# Patient Record
Sex: Female | Born: 1950
Health system: Southern US, Community
[De-identification: ages and names within clinical notes are randomized; demographics above are authoritative.]

## PROBLEM LIST (undated history)

## (undated) DIAGNOSIS — I10 Essential (primary) hypertension: Secondary | ICD-10-CM

## (undated) DIAGNOSIS — F32A Depression, unspecified: Secondary | ICD-10-CM

## (undated) DIAGNOSIS — F329 Major depressive disorder, single episode, unspecified: Secondary | ICD-10-CM

## (undated) DIAGNOSIS — E079 Disorder of thyroid, unspecified: Secondary | ICD-10-CM

## (undated) DIAGNOSIS — C4490 Unspecified malignant neoplasm of skin, unspecified: Secondary | ICD-10-CM

## (undated) DIAGNOSIS — M1990 Unspecified osteoarthritis, unspecified site: Secondary | ICD-10-CM

## (undated) DIAGNOSIS — G252 Other specified forms of tremor: Secondary | ICD-10-CM

## (undated) DIAGNOSIS — D496 Neoplasm of unspecified behavior of brain: Secondary | ICD-10-CM

## (undated) HISTORY — PX: TONSILLECTOMY: SUR1361

## (undated) HISTORY — PX: ANKLE SURGERY: SHX546

## (undated) HISTORY — PX: ADENOIDECTOMY: SUR15

## (undated) HISTORY — PX: CERVICAL SPINE SURGERY: SHX589

## (undated) HISTORY — PX: ROTATOR CUFF REPAIR: SHX139

## (undated) HISTORY — PX: THYROID SURGERY: SHX805

## (undated) HISTORY — PX: DEEP BRAIN STIMULATOR PLACEMENT: SHX608

## (undated) HISTORY — PX: BRAIN SURGERY: SHX531

## (undated) HISTORY — PX: ABDOMINAL HYSTERECTOMY: SHX81

## (undated) HISTORY — PX: BACK SURGERY: SHX140

## (undated) HISTORY — PX: LUMBAR SPINE SURGERY: SHX701

---

## 2010-08-21 ENCOUNTER — Emergency Department (INDEPENDENT_AMBULATORY_CARE_PROVIDER_SITE_OTHER): Payer: BC Managed Care – PPO

## 2010-08-21 ENCOUNTER — Emergency Department (HOSPITAL_BASED_OUTPATIENT_CLINIC_OR_DEPARTMENT_OTHER)
Admission: EM | Admit: 2010-08-21 | Discharge: 2010-08-21 | Disposition: A | Discharge status: Home / Self Care | Payer: BC Managed Care – PPO | Attending: Emergency Medicine | Admitting: Emergency Medicine

## 2010-08-21 DIAGNOSIS — E041 Nontoxic single thyroid nodule: Secondary | ICD-10-CM

## 2010-08-21 DIAGNOSIS — R079 Chest pain, unspecified: Secondary | ICD-10-CM

## 2010-08-21 DIAGNOSIS — E049 Nontoxic goiter, unspecified: Secondary | ICD-10-CM | POA: Insufficient documentation

## 2010-08-21 DIAGNOSIS — S2249XA Multiple fractures of ribs, unspecified side, initial encounter for closed fracture: Secondary | ICD-10-CM | POA: Insufficient documentation

## 2010-08-21 DIAGNOSIS — I517 Cardiomegaly: Secondary | ICD-10-CM | POA: Insufficient documentation

## 2010-08-21 DIAGNOSIS — R0789 Other chest pain: Secondary | ICD-10-CM

## 2010-08-21 DIAGNOSIS — Z79899 Other long term (current) drug therapy: Secondary | ICD-10-CM | POA: Insufficient documentation

## 2010-08-21 LAB — COMPREHENSIVE METABOLIC PANEL
ALT: 7 U/L (ref 0–35)
AST: 12 U/L (ref 0–37)
Albumin: 3.3 g/dL — ABNORMAL LOW (ref 3.5–5.2)
Alkaline Phosphatase: 93 U/L (ref 39–117)
BUN: 10 mg/dL (ref 6–23)
CO2: 29 mEq/L (ref 19–32)
Calcium: 9.1 mg/dL (ref 8.4–10.5)
Chloride: 97 mEq/L (ref 96–112)
Creatinine, Ser: 0.9 mg/dL (ref 0.4–1.2)
GFR calc Af Amer: >60 mL/min (ref >60)
GFR calc non Af Amer: >60 mL/min (ref >60)
Glucose, Bld: 97 mg/dL (ref 70–99)
Potassium: 3.1 mEq/L — ABNORMAL LOW (ref 3.5–5.1)
Sodium: 136 mEq/L (ref 135–145)
Total Bilirubin: 0.2 mg/dL — ABNORMAL LOW (ref 0.3–1.2)
Total Protein: 8 g/dL (ref 6.0–8.3)

## 2010-08-21 LAB — DIFFERENTIAL
Basophils Absolute: 0 10*3/uL (ref 0.0–0.1)
Basophils Relative: 0 % (ref 0–1)
Eosinophils Absolute: 0.2 10*3/uL (ref 0.0–0.7)
Eosinophils Relative: 3 % (ref 0–5)
Lymphocytes Relative: 23 % (ref 12–46)
Lymphs Abs: 2 10*3/uL (ref 0.7–4.0)
Monocytes Absolute: 0.8 10*3/uL (ref 0.1–1.0)
Monocytes Relative: 9 % (ref 3–12)
Neutro Abs: 5.7 10*3/uL (ref 1.7–7.7)
Neutrophils Relative %: 66 % (ref 43–77)

## 2010-08-21 LAB — CBC
HCT: 34.4 % — ABNORMAL LOW (ref 36.0–46.0)
Hemoglobin: 11.6 g/dL — ABNORMAL LOW (ref 12.0–15.0)
MCH: 30.4 pg (ref 26.0–34.0)
MCHC: 33.7 g/dL (ref 30.0–36.0)
MCV: 90.3 fL (ref 78.0–100.0)
Platelets: 349 10*3/uL (ref 150–400)
RBC: 3.81 MIL/uL — ABNORMAL LOW (ref 3.87–5.11)
RDW: 12.8 % (ref 11.5–15.5)
WBC: 8.8 10*3/uL (ref 4.0–10.5)

## 2010-08-21 LAB — D-DIMER, QUANTITATIVE: D-Dimer, Quant: 0.72 ug/mL-FEU — ABNORMAL HIGH (ref 0.00–0.48)

## 2010-08-21 MED ORDER — IOHEXOL 350 MG/ML SOLN
80.0000 mL | Freq: Once | INTRAVENOUS | Status: AC | PRN
  Administered 2010-08-21: 80 mL via INTRAVENOUS

## 2011-06-13 ENCOUNTER — Emergency Department (INDEPENDENT_AMBULATORY_CARE_PROVIDER_SITE_OTHER): Payer: BC Managed Care – PPO

## 2011-06-13 ENCOUNTER — Encounter (HOSPITAL_BASED_OUTPATIENT_CLINIC_OR_DEPARTMENT_OTHER): Payer: Self-pay | Admitting: *Deleted

## 2011-06-13 ENCOUNTER — Emergency Department (HOSPITAL_BASED_OUTPATIENT_CLINIC_OR_DEPARTMENT_OTHER)
Admission: EM | Admit: 2011-06-13 | Discharge: 2011-06-13 | Disposition: A | Discharge status: Home Health | Payer: BC Managed Care – PPO | Attending: Emergency Medicine | Admitting: Emergency Medicine

## 2011-06-13 DIAGNOSIS — W19XXXA Unspecified fall, initial encounter: Secondary | ICD-10-CM | POA: Insufficient documentation

## 2011-06-13 DIAGNOSIS — S4990XA Unspecified injury of shoulder and upper arm, unspecified arm, initial encounter: Secondary | ICD-10-CM

## 2011-06-13 DIAGNOSIS — S4980XA Other specified injuries of shoulder and upper arm, unspecified arm, initial encounter: Secondary | ICD-10-CM | POA: Insufficient documentation

## 2011-06-13 DIAGNOSIS — S46909A Unspecified injury of unspecified muscle, fascia and tendon at shoulder and upper arm level, unspecified arm, initial encounter: Secondary | ICD-10-CM | POA: Insufficient documentation

## 2011-06-13 DIAGNOSIS — S43109A Unspecified dislocation of unspecified acromioclavicular joint, initial encounter: Secondary | ICD-10-CM

## 2011-06-13 DIAGNOSIS — E079 Disorder of thyroid, unspecified: Secondary | ICD-10-CM | POA: Insufficient documentation

## 2011-06-13 HISTORY — DX: Depression, unspecified: F32.A

## 2011-06-13 HISTORY — DX: Neoplasm of unspecified behavior of brain: D49.6

## 2011-06-13 HISTORY — DX: Disorder of thyroid, unspecified: E07.9

## 2011-06-13 HISTORY — DX: Major depressive disorder, single episode, unspecified: F32.9

## 2011-06-13 HISTORY — DX: Other specified forms of tremor: G25.2

## 2011-06-13 MED ORDER — CYCLOBENZAPRINE HCL 10 MG PO TABS
5.0000 mg | ORAL_TABLET | Freq: Once | ORAL | Status: AC
  Administered 2011-06-13: 5 mg via ORAL
  Filled 2011-06-13: qty 1

## 2011-06-13 MED ORDER — HYDROCODONE-ACETAMINOPHEN 5-500 MG PO TABS: 1.0000 | ORAL_TABLET | Freq: Four times a day (QID) | ORAL | Status: AC | PRN

## 2011-06-13 MED ORDER — HYDROCODONE-ACETAMINOPHEN 5-325 MG PO TABS
1.0000 | ORAL_TABLET | Freq: Once | ORAL | Status: AC
  Administered 2011-06-13: 1 via ORAL
  Filled 2011-06-13: qty 1

## 2011-06-13 MED ORDER — CYCLOBENZAPRINE HCL 10 MG PO TABS: 10.0000 mg | ORAL_TABLET | Freq: Two times a day (BID) | ORAL | Status: AC | PRN

## 2011-06-13 NOTE — ED Notes (Signed)
Went to discharge pt.  Pt requesting either a stronger pain medication or more tablets of hydrocodone.  Spoke to [place],  She related to follow up with orthopedic physician for additional pain medication.  Pt related verbal understanding.

## 2011-06-13 NOTE — ED Provider Notes (Signed)
History     CSN: [id number]  Arrival date & time 06/13/11  1342   First MD Initiated Contact with Patient 06/13/11 1403      Chief Complaint  Patient presents with  . Fall    (Consider location/radiation/quality/duration/timing/severity/associated sxs/prior treatment) HPI Comments: Pt states that she fell on to her left shoulder and heard a crack:pt states that she has pain with movement:no weakness or numbness:pt states that ibuprofen didn't work  Patient is a 61 y.o. female presenting with fall. The history is provided by the patient. No language interpreter was used.  Fall The accident occurred 2 days ago. She landed on a hard floor. The point of impact was the left shoulder. The pain is moderate. She was ambulatory at the scene. There was no entrapment after the fall. There was no drug use involved in the accident. There was no alcohol use involved in the accident. Pertinent negatives include no abdominal pain, no bowel incontinence, no hearing loss, no loss of consciousness and no tingling. The symptoms are aggravated by activity. She has tried NSAIDs for the symptoms.    Past Medical History  Diagnosis Date  . Postural Tremor   . Thyroid disease   . Brain tumor   . Depression     Past Surgical History  Procedure Date  . Abdominal hysterectomy   . Thyroid surgery   . Back surgery   . Brain surgery   . Rotator cuff repair   . Ankle surgery   . Tonsillectomy   . Adenoidectomy     History reviewed. No pertinent family history.  History  Substance Use Topics  . Smoking status: Never Smoker   . Smokeless tobacco: Not on file  . Alcohol Use: No    OB History    Grav Para Term Preterm Abortions TAB SAB Ect Mult Living                  Review of Systems  Gastrointestinal: Negative for abdominal pain and bowel incontinence.  Neurological: Negative for tingling and loss of consciousness.  All other systems reviewed and are negative.    Allergies  Sulfa  antibiotics and Codeine  Home Medications   Current Outpatient Rx  Name Route Sig Dispense Refill  . KLONOPIN PO Oral Take 1 tablet by mouth 3 (three) times daily.    Marland Kitchen PREMARIN PO Oral Take 1 tablet by mouth 1 day or 1 dose.    Marland Kitchen SYNTHROID PO Oral Take 1 tablet by mouth once.    . INDERAL PO Oral Take 1 tablet by mouth 1 day or 1 dose.    . EFFEXOR PO Oral Take 1 tablet by mouth 1 day or 1 dose.      BP 131/84  Pulse 74  Temp(Src) 98.1 F (36.7 C) (Oral)  Resp 20  Ht 5\' 5"  (1.651 m)  Wt 180 lb (81.647 kg)  BMI 29.95 kg/m2  SpO2 98%  Physical Exam  Nursing note and vitals reviewed. Constitutional: She is oriented to person, place, and time. She appears well-developed and well-nourished.  HENT:  Head: Normocephalic and atraumatic.  Eyes: EOM are normal.  Neck: Neck supple.  Cardiovascular: Normal rate and regular rhythm.   Pulmonary/Chest: Effort normal and breath sounds normal.  Abdominal: Soft. Bowel sounds are normal.  Musculoskeletal: Normal range of motion.       Pt tender on the left shoulder pt unable to posteriorly rotate:no gross deformity noted  Neurological: She is alert and oriented to person,  place, and time.  Skin: Skin is warm and dry.  Psychiatric: She has a normal mood and affect.    ED Course  Procedures (including critical care time)  Labs Reviewed - No data to display Dg Shoulder Left  06/13/2011  *RADIOLOGY REPORT*  Clinical Data: Fall  LEFT SHOULDER - 2+ VIEW  Comparison: None.  Findings: The left AC joint is slightly displaced and widened.  No acute fracture.  Glenohumeral joint is anatomically aligned.  No fracture.  IMPRESSION: The above findings suggest AC joint injury.  Original Report Authenticated By: Donavan Burnet, M.D.     1. Acromioclavicular [facility]) joint injury       MDM  Pt placed in a sling for comfort:pt is okay to follow up with her orthopedist:will treat symptomatically        Teressa Lower, NP 06/13/11 1519

## 2011-06-13 NOTE — ED Provider Notes (Signed)
Medical screening examination/treatment/procedure(s) were performed by non-physician practitioner and as supervising physician I was immediately available for consultation/collaboration.   Kadasia Kassing A Alvera Tourigny, MD 06/13/11 1530 

## 2011-06-13 NOTE — ED Notes (Signed)
Pt states she "had about with sciatic pain the other day and was taking out the trash when she fell into a glass door (did not break) Now c/o left shoulder pain and spasm down into arm and shoulder blade.  Hurting in right arm as well. Ambulatory to ED.

## 2011-10-09 ENCOUNTER — Emergency Department (HOSPITAL_BASED_OUTPATIENT_CLINIC_OR_DEPARTMENT_OTHER)
Admission: EM | Admit: 2011-10-09 | Discharge: 2011-10-09 | Disposition: A | Discharge status: Home / Self Care | Payer: Medicare Other | Attending: Emergency Medicine | Admitting: Emergency Medicine

## 2011-10-09 ENCOUNTER — Emergency Department (HOSPITAL_BASED_OUTPATIENT_CLINIC_OR_DEPARTMENT_OTHER): Payer: Medicare Other

## 2011-10-09 ENCOUNTER — Encounter (HOSPITAL_BASED_OUTPATIENT_CLINIC_OR_DEPARTMENT_OTHER): Payer: Self-pay | Admitting: *Deleted

## 2011-10-09 DIAGNOSIS — M25539 Pain in unspecified wrist: Secondary | ICD-10-CM | POA: Insufficient documentation

## 2011-10-09 DIAGNOSIS — F3289 Other specified depressive episodes: Secondary | ICD-10-CM | POA: Insufficient documentation

## 2011-10-09 DIAGNOSIS — F329 Major depressive disorder, single episode, unspecified: Secondary | ICD-10-CM | POA: Insufficient documentation

## 2011-10-09 MED ORDER — MELOXICAM 7.5 MG PO TABS: 7.5000 mg | ORAL_TABLET | Freq: Every day | ORAL | Status: DC

## 2011-10-09 NOTE — ED Provider Notes (Signed)
History     CSN: [id number]  Arrival date & time 10/09/11  1419   First MD Initiated Contact with Patient 10/09/11 1440      Chief Complaint  Patient presents with  . Fall    (Consider location/radiation/quality/duration/timing/severity/associated sxs/prior treatment) HPI Comments: Patient presents with complaint of bilateral wrist pain that has been gradually worsening over the past 2 weeks. Pain began after the patient had a fall from standing onto a hard surface onto her wrists. She had no evaluation initially after the fall. Patient has been taking ibuprofen several times a day for pain with mild relief. Movement of her wrist makes the pain worse. Onset was acute. Patient denies other injuries in the fall.  Patient is a 61 y.o. female presenting with fall. The history is provided by the patient.  Fall The accident occurred more than 1 week ago. The fall occurred while walking. Distance fallen: From standing. She landed on a hard floor. The pain is mild. Pertinent negatives include no numbness. The symptoms are aggravated by use of the injured limb. She has tried NSAIDs for the symptoms. The treatment provided mild relief.    Past Medical History  Diagnosis Date  . Postural Tremor   . Thyroid disease   . Brain tumor   . Depression     Past Surgical History  Procedure Date  . Abdominal hysterectomy   . Thyroid surgery   . Back surgery   . Brain surgery   . Rotator cuff repair   . Ankle surgery   . Tonsillectomy   . Adenoidectomy     No family history on file.  History  Substance Use Topics  . Smoking status: Never Smoker   . Smokeless tobacco: Not on file  . Alcohol Use: No    OB History    Grav Para Term Preterm Abortions TAB SAB Ect Mult Living                  Review of Systems  Constitutional: Negative for activity change.  HENT: Negative for neck pain.   Musculoskeletal: Positive for arthralgias. Negative for back pain and joint swelling.  Skin:  Negative for wound.  Neurological: Negative for weakness and numbness.    Allergies  Sulfa antibiotics and Codeine  Home Medications   Current Outpatient Rx  Name Route Sig Dispense Refill  . KLONOPIN PO Oral Take 1 tablet by mouth 3 (three) times daily.    Marland Kitchen PREMARIN PO Oral Take 1 tablet by mouth 1 day or 1 dose.    Marland Kitchen SYNTHROID PO Oral Take 1 tablet by mouth once.    . INDERAL PO Oral Take 1 tablet by mouth 1 day or 1 dose.    . EFFEXOR PO Oral Take 1 tablet by mouth 1 day or 1 dose.      BP 152/87  Pulse 60  Temp 97.9 F (36.6 C) (Oral)  Resp 20  SpO2 99%  Physical Exam  Nursing note and vitals reviewed. Constitutional: She is oriented to person, place, and time. She appears well-developed and well-nourished.  HENT:  Head: Normocephalic and atraumatic.  Eyes: Pupils are equal, round, and reactive to light.  Neck: Normal range of motion. Neck supple.  Cardiovascular: Exam reveals no decreased pulses.   Musculoskeletal: Normal range of motion. She exhibits tenderness. She exhibits no edema.       Right wrist: She exhibits tenderness (diffuse). She exhibits normal range of motion.       Left wrist:  She exhibits tenderness. She exhibits normal range of motion.       Right hand: normal sensation noted. Decreased sensation is not present in the ulnar distribution, is not present in the medial distribution and is not present in the radial distribution. Normal strength noted. She exhibits no finger abduction, no thumb/finger opposition and no wrist extension trouble.       Left hand: Normal. normal sensation noted. Decreased sensation is not present in the ulnar distribution, is not present in the medial redistribution and is not present in the radial distribution. Normal strength noted. She exhibits no finger abduction, no thumb/finger opposition and no wrist extension trouble.       No anatomic snuffbox tenderness bilaterally.  Neurological: She is alert and oriented to person,  place, and time. No sensory deficit.       Motor, sensation, and vascular distal to the injury is fully intact.   Skin: Skin is warm and dry.  Psychiatric: She has a normal mood and affect.    ED Course  Procedures (including critical care time)  Labs Reviewed - No data to display Dg Wrist Complete Left  10/09/2011  *RADIOLOGY REPORT*  Clinical Data: Fall.  Left wrist pain.  LEFT WRIST - COMPLETE 3+ VIEW  Comparison: Contralateral extremity same day.  Findings: Anatomic alignment of the left wrist.  Nonspecific carpal cyst is present within the lunate.  Scaphoid bone is intact. Moderate STT joint osteoarthritis is present.  Small erosion is present at the STT joint involving the distal scaphoid pole, probably degenerative but nonspecific. No fracture.  IMPRESSION: 1.  No acute osseous abnormality. 2.  Moderate STT joint osteoarthritis.  Small erosion in the distal scaphoid pole is nonspecific.  This could be associated with an inflammatory arthropathy such as rheumatoid or ordinary osteoarthritis with synovitis.  Original Report Authenticated By: Andreas Newport, M.D.   Dg Wrist Complete Right  10/09/2011  *RADIOLOGY REPORT*  Clinical Data: Fall 2 months ago.  Left wrist pain.  RIGHT WRIST - COMPLETE 3+ VIEW  Comparison: Contralateral extremity same day.  Findings: There is no fracture.  Anatomic alignment.  Moderate STT joint osteoarthritis is present.  No erosions.  Soft tissues appear within normal limits. Scaphoid view is normal.  IMPRESSION: Osteoarthritis of the wrist.  No acute osseous abnormality.  Original Report Authenticated By: Andreas Newport, M.D.     1. Wrist pain     3:13 PM Patient seen and examined. X-rays reviewed. Patient informed of results.   Vital signs reviewed and are as follows: Filed Vitals:   10/09/11 1425  BP: 152/87  Pulse: 60  Temp: 97.9 F (36.6 C)  Resp: 20   Will use trial rx NSAID for pain. Told not to continue taking ibuprofen if taking this. If this  does not improve symptoms, patient has [facility]opedic f/[facility] in [facility] and she was encouraged to follow-up.    MDM  Wrist pain, s/p fall 2 weeks ago. Full ROM of wrists. X-rays neg. Conservative management indicated with ortho f/u if not improving.         [place], [place] 10/09/11 [phone]

## 2011-10-09 NOTE — Discharge Instructions (Signed)
Please read and follow all provided instructions.  Your diagnoses today include:  1. Wrist pain     Tests performed today include:  Xrays of your wrists - do NOT show any broken bones  Vital signs. See below for your results today.   Medications prescribed:   Mobic - anti-inflammatory pain medication Do not take your ibuprofen while taking this medication  You have been prescribed an anti-inflammatory medication or NSAID. Take with food. Take smallest effective dose for the shortest duration needed for your pain. Stop taking if you experience stomach pain or vomiting.   Take any prescribed medications only as directed.  Home care instructions:   Follow any educational materials contained in this packet  Follow R.I.C.E. Protocol:  R - rest your injury   I  - use ice on injury without applying directly to skin  C - compress injury with bandage or splint  E - elevate the injury as much as possible  Follow-up instructions: Please follow-up with your primary care provider or the provided orthopedic (bone specialist) if you continue to have significant pain in 2 weeks.   If you do not have a primary care doctor -- see below for referral information.   Return instructions:   Please return if your toes are numb or tingling, appear gray or blue, or you have severe pain (also elevate leg and loosen splint or wrap)  Please return to the Emergency Department if you experience worsening symptoms.   Please return if you have any other emergent concerns.  Additional Information:  Your vital signs today were: BP 152/87  Pulse 60  Temp 97.9 F (36.6 C) (Oral)  Resp 20  SpO2 99% If your blood pressure (BP) was elevated above 135/85 this visit, please have this repeated by your doctor within one month. -------------- -------------- No Primary Care Doctor Call [facility]  [phone] Other agencies that provide inexpensive medical care    [facility]   [phone]    [facility]  [phone]    [facility]  [phone]    [facility]  [phone]    [facility]  [phone]    [facility]  [phone] -------------- RESOURCE GUIDE:  Dental Problems  Patients with Medicaid: [facility] [phone] [address]                                            [phone] [address] Phone:  [phone]                                                   Phone:  [phone]  If unable to pay or uninsured, contact:  [facility] or [facility]. to become qualified for the adult dental clinic.  Chronic Pain Problems Contact [facility]  [phone] Patients need to be referred by their primary care doctor.  Insufficient Money for Medicine Contact [employer]:  call "211" or [facility] [phone].  Psychological Services [facility]  [phone] [facility]  [phone] [facility]  [phone] (emergency services [phone])  Substance Abuse Resources [facility]  [phone] [facility] [phone] [facility] [place] [facility] [phone] [facility] [phone] Residential & Outpatient Substance Abuse Program  [phone]  Abuse/Neglect [facility] Child Abuse Hotline 613-844-9009 [facility] Child Abuse Hotline [phone] (After Hours)  Emergency Shelter [facility] [phone]  Maternity Homes [facility] [place] [facility] [phone] [facility] [phone]  [facility] Resources  [facility] [place] [facility]     [employer]                          [facility] Dept. [address] [place]                       [address]      [address]  [place]ondell Reveal Phone:  [phone]                                    Phone:  [phone]                 Phone:  [phone]  [facility] Phone:  [phone]  [facility] Child Abuse Hotline [phone] [phone] (After Hours)

## 2011-10-09 NOTE — ED Notes (Signed)
Fell 2 weeks ago. Injury to both wrist. Pain is not getting better.

## 2011-10-10 NOTE — ED Provider Notes (Signed)
Medical screening examination/treatment/procedure(s) were performed by non-physician practitioner and as supervising physician I was immediately available for consultation/collaboration.  Hurman Horn, MD 10/10/11 [phone]

## 2011-12-12 ENCOUNTER — Encounter (HOSPITAL_BASED_OUTPATIENT_CLINIC_OR_DEPARTMENT_OTHER): Payer: Self-pay | Admitting: *Deleted

## 2011-12-12 ENCOUNTER — Emergency Department (HOSPITAL_BASED_OUTPATIENT_CLINIC_OR_DEPARTMENT_OTHER)
Admission: EM | Admit: 2011-12-12 | Discharge: 2011-12-12 | Disposition: A | Discharge status: Home / Self Care | Payer: Medicare Other | Attending: Emergency Medicine | Admitting: Emergency Medicine

## 2011-12-12 DIAGNOSIS — M129 Arthropathy, unspecified: Secondary | ICD-10-CM | POA: Insufficient documentation

## 2011-12-12 DIAGNOSIS — M5410 Radiculopathy, site unspecified: Secondary | ICD-10-CM

## 2011-12-12 DIAGNOSIS — IMO0002 Reserved for concepts with insufficient information to code with codable children: Secondary | ICD-10-CM | POA: Insufficient documentation

## 2011-12-12 DIAGNOSIS — Z85828 Personal history of other malignant neoplasm of skin: Secondary | ICD-10-CM | POA: Insufficient documentation

## 2011-12-12 DIAGNOSIS — E079 Disorder of thyroid, unspecified: Secondary | ICD-10-CM | POA: Insufficient documentation

## 2011-12-12 HISTORY — DX: Unspecified malignant neoplasm of skin, unspecified: C44.90

## 2011-12-12 HISTORY — DX: Unspecified osteoarthritis, unspecified site: M19.90

## 2011-12-12 NOTE — ED Notes (Signed)
Multiple recent spinal surgeries- states left leg weak and "limp" since Wednesday- ambulates with limp- grips equal, facial symmetry presentl- states "my leg does not feel right"

## 2011-12-12 NOTE — ED Notes (Signed)
MD at bedside speaking with patient.

## 2011-12-12 NOTE — ED Provider Notes (Signed)
History  This chart was scribed for Lynn Jakes, MD by Lynn Santos. This patient was seen in room MH01/MH01 and the patient's care was started at 2003.   CSN: [id number]  Arrival date & time 12/12/11  2003   First MD Initiated Contact with Patient 12/12/11 2151      Chief Complaint  Patient presents with  . Extremity Weakness   Patient is a 61 y.o. female presenting with extremity weakness. The history is provided by the patient. No language interpreter was used.  Extremity Weakness This is a new problem. The current episode started more than 2 days ago. The problem occurs constantly. The problem has not changed since onset.Pertinent negatives include no chest pain, no abdominal pain and no shortness of breath. The symptoms are aggravated by walking. Nothing relieves the symptoms. She has tried nothing for the symptoms.   Lynn Santos is a 61 y.o. female who presents to the Emergency Department complaining of constant left leg weakness which began 3 days ago and she also had recent low back surgery for sciatica/ruptured disk at [facility] 2 weeks ago. She states she first noticed her left leg weakness when she was walking to the bathroom three days ago and had to limp because of her weakness. She has not experienced any weakness of her left leg before. She states she used to have numbness of her left foot before her back surgery but denies any numbness today. She denies any back pain, neck pain, foot/leg numbness, fever, and abdominal pain. She states has been constantly nauseated the past several weeks and has not been taking any pain medicines. She has had 2 previous neck surgeries and a previous back surgery years ago.    Her PCP is Dr. Tania Santos at [facility].  Past Medical History  Diagnosis Date  . Postural Tremor   . Thyroid disease   . Brain tumor   . Depression   . Skin cancer   . Arthritis     Past Surgical History  Procedure Date  .  Abdominal hysterectomy   . Thyroid surgery   . Back surgery   . Brain surgery   . Rotator cuff repair   . Ankle surgery   . Tonsillectomy   . Adenoidectomy   . Cervical spine surgery   . Lumbar spine surgery     No family history on file.  History  Substance Use Topics  . Smoking status: Never Smoker   . Smokeless tobacco: Never Used  . Alcohol Use: No    OB History    Grav Para Term Preterm Abortions TAB SAB Ect Mult Living                  Review of Systems  Constitutional: Negative for fever, chills and activity change.  HENT: Negative for congestion and neck pain.   Respiratory: Negative for cough and shortness of breath.   Cardiovascular: Negative for chest pain and leg swelling.  Gastrointestinal: Positive for nausea. Negative for vomiting, abdominal pain and abdominal distention.  Musculoskeletal: Positive for gait problem (Limping) and extremity weakness. Negative for back pain.  Skin:       Healing incision of her lower back from her back surgery.   Neurological: Positive for weakness (Weakness of her left leg.). Negative for numbness.  All other systems reviewed and are negative.    Allergies  Sulfa antibiotics and Codeine  Home Medications   Current Outpatient Rx  Name Route Sig Dispense Refill  .  CLONAZEPAM 2 MG PO TABS Oral Take 4 mg by mouth 3 (three) times daily.    Marland Kitchen EPINEPHRINE 0.3 MG/0.3ML IJ DEVI Intramuscular Inject 0.3 mg into the muscle once.    Marland Kitchen PREMARIN PO Oral Take 1 tablet by mouth daily.     . IBUPROFEN 200 MG PO TABS Oral Take 800 mg by mouth every 8 (eight) hours as needed. For pain    . SYNTHROID PO Oral Take 1 tablet by mouth daily.     Marland Kitchen PROPRANOLOL HCL ER PO Oral Take 1 tablet by mouth daily.    . EFFEXOR XR PO Oral Take 1 capsule by mouth daily.      Triage Vitals: BP 153/79  Pulse 63  Temp 98 F (36.7 C) (Oral)  Resp 18  Ht 5\' 4"  (1.626 m)  Wt 190 lb (86.183 kg)  BMI 32.61 kg/m2  SpO2 98%  Physical Exam  Nursing  note and vitals reviewed. Constitutional: She is oriented to person, place, and time. She appears well-developed and well-nourished. No distress.  HENT:  Head: Normocephalic and atraumatic.  Eyes: EOM are normal.  Neck: Neck supple. No tracheal deviation present.  Cardiovascular: Normal rate, regular rhythm and normal heart sounds.   No murmur heard. Pulmonary/Chest: Effort normal and breath sounds normal. No respiratory distress. She has no wheezes. She has no rales.  Abdominal: Soft. Bowel sounds are normal. She exhibits no distension. There is no tenderness. There is no rebound and no guarding.  Musculoskeletal: Normal range of motion.  Neurological: She is alert and oriented to person, place, and time.       4/5 Weakness to extension at knee left leg compared to right.   Skin: Skin is warm and dry.       Old well healed vertical incision lower back. 4 cm horizontal new incision towards left side, well healed with no redness, tender or swelling.   Psychiatric: She has a normal mood and affect. Her behavior is normal.    ED Course  Procedures (including critical care time) DIAGNOSTIC STUDIES: Oxygen Saturation is 98% on room air, normal by my interpretation.    COORDINATION OF CARE: At 1015 Discussed treatment plan with patient which includes following up with her orthopedist. Patient agrees.   Labs Reviewed - No data to display No results found.   1. 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 Patient starting about one week ago started having quadricep weakness in the left leg same side that the sciatica problem was on no further sciatica no further weakness or numbness in the foot everything is just weakness in the quadricep muscle. No evidence of infection looking at the wound no fever patient has been nausea it for about a week and has had some decreased appetite no tenderness  to palpation to the back.  Patient would benefit most from an MRI in the distribution of this is more in the femoral nerve area upper lumbar. No evidence of significant infection at this time the patient's head MRI over the next few days she'll followup with orthopedic doctor or return here on Tuesday when MRI machine is available. She will return sooner for new worse symptoms.   I personally performed the services described in this documentation, which was scribed in my presence. The recorded information has been reviewed and considered.  Lynn Jakes, MD 12/12/11 2230

## 2011-12-12 NOTE — ED Notes (Signed)
Patient post back and neck surgery  x 2 weeks ago.  Since Wednesday, patient reports "weakness" to left leg.  Pt reports than she is unable to bear weight on left leg because "I feel like I'm going to fall."  Pt reports no pain or numbness.

## 2012-03-30 ENCOUNTER — Emergency Department (HOSPITAL_BASED_OUTPATIENT_CLINIC_OR_DEPARTMENT_OTHER): Payer: Medicare Other

## 2012-03-30 ENCOUNTER — Emergency Department (HOSPITAL_BASED_OUTPATIENT_CLINIC_OR_DEPARTMENT_OTHER)
Admission: EM | Admit: 2012-03-30 | Discharge: 2012-03-30 | Disposition: A | Discharge status: Home / Self Care | Payer: Medicare Other | Attending: Emergency Medicine | Admitting: Emergency Medicine

## 2012-03-30 ENCOUNTER — Encounter (HOSPITAL_BASED_OUTPATIENT_CLINIC_OR_DEPARTMENT_OTHER): Payer: Self-pay | Admitting: *Deleted

## 2012-03-30 DIAGNOSIS — I10 Essential (primary) hypertension: Secondary | ICD-10-CM | POA: Insufficient documentation

## 2012-03-30 DIAGNOSIS — Z85828 Personal history of other malignant neoplasm of skin: Secondary | ICD-10-CM | POA: Insufficient documentation

## 2012-03-30 DIAGNOSIS — Z79899 Other long term (current) drug therapy: Secondary | ICD-10-CM | POA: Insufficient documentation

## 2012-03-30 DIAGNOSIS — R072 Precordial pain: Secondary | ICD-10-CM | POA: Insufficient documentation

## 2012-03-30 DIAGNOSIS — Z8639 Personal history of other endocrine, nutritional and metabolic disease: Secondary | ICD-10-CM | POA: Insufficient documentation

## 2012-03-30 DIAGNOSIS — Z862 Personal history of diseases of the blood and blood-forming organs and certain disorders involving the immune mechanism: Secondary | ICD-10-CM | POA: Insufficient documentation

## 2012-03-30 DIAGNOSIS — R259 Unspecified abnormal involuntary movements: Secondary | ICD-10-CM | POA: Insufficient documentation

## 2012-03-30 DIAGNOSIS — F3289 Other specified depressive episodes: Secondary | ICD-10-CM | POA: Insufficient documentation

## 2012-03-30 DIAGNOSIS — Z8739 Personal history of other diseases of the musculoskeletal system and connective tissue: Secondary | ICD-10-CM | POA: Insufficient documentation

## 2012-03-30 DIAGNOSIS — Z85841 Personal history of malignant neoplasm of brain: Secondary | ICD-10-CM | POA: Insufficient documentation

## 2012-03-30 DIAGNOSIS — F329 Major depressive disorder, single episode, unspecified: Secondary | ICD-10-CM | POA: Insufficient documentation

## 2012-03-30 DIAGNOSIS — R42 Dizziness and giddiness: Secondary | ICD-10-CM

## 2012-03-30 LAB — CBC WITH DIFFERENTIAL/PLATELET
Basophils Absolute: 0 10*3/uL (ref 0.0–0.1)
Basophils Relative: 0 % (ref 0–1)
Eosinophils Absolute: 0.2 10*3/uL (ref 0.0–0.7)
Eosinophils Relative: 2 % (ref 0–5)
HCT: 35.3 % — ABNORMAL LOW (ref 36.0–46.0)
Hemoglobin: 11.6 g/dL — ABNORMAL LOW (ref 12.0–15.0)
Lymphocytes Relative: 31 % (ref 12–46)
Lymphs Abs: 2.2 10*3/uL (ref 0.7–4.0)
MCH: 29 pg (ref 26.0–34.0)
MCHC: 32.9 g/dL (ref 30.0–36.0)
MCV: 88.3 fL (ref 78.0–100.0)
Monocytes Absolute: 0.8 10*3/uL (ref 0.1–1.0)
Monocytes Relative: 11 % (ref 3–12)
Neutro Abs: 4 10*3/uL (ref 1.7–7.7)
Neutrophils Relative %: 56 % (ref 43–77)
Platelets: 304 10*3/uL (ref 150–400)
RBC: 4 MIL/uL (ref 3.87–5.11)
RDW: 13.2 % (ref 11.5–15.5)
WBC: 7.2 10*3/uL (ref 4.0–10.5)

## 2012-03-30 LAB — COMPREHENSIVE METABOLIC PANEL
ALT: 7 U/L (ref 0–35)
AST: 14 U/L (ref 0–37)
Albumin: 3.1 g/dL — ABNORMAL LOW (ref 3.5–5.2)
Alkaline Phosphatase: 89 U/L (ref 39–117)
BUN: 8 mg/dL (ref 6–23)
CO2: 26 mEq/L (ref 19–32)
Calcium: 8.6 mg/dL (ref 8.4–10.5)
Chloride: 100 mEq/L (ref 96–112)
Creatinine, Ser: 0.8 mg/dL (ref 0.50–1.10)
GFR calc Af Amer: 89 mL/min — ABNORMAL LOW (ref >90)
GFR calc non Af Amer: 77 mL/min — ABNORMAL LOW (ref >90)
Glucose, Bld: 112 mg/dL — ABNORMAL HIGH (ref 70–99)
Potassium: 3.7 mEq/L (ref 3.5–5.1)
Sodium: 136 mEq/L (ref 135–145)
Total Bilirubin: 0.1 mg/dL — ABNORMAL LOW (ref 0.3–1.2)
Total Protein: 7.9 g/dL (ref 6.0–8.3)

## 2012-03-30 LAB — D-DIMER, QUANTITATIVE: D-Dimer, Quant: 0.73 ug/mL-FEU — ABNORMAL HIGH (ref 0.00–0.48)

## 2012-03-30 LAB — TROPONIN I
Troponin I: <0.3 ng/mL (ref <0.30)
Troponin I: <0.3 ng/mL (ref <0.30)

## 2012-03-30 LAB — PRO B NATRIURETIC PEPTIDE: Pro B Natriuretic peptide (BNP): 250.3 pg/mL — ABNORMAL HIGH (ref 0–125)

## 2012-03-30 MED ORDER — IOHEXOL 350 MG/ML SOLN: 80.0000 mL | Freq: Once | INTRAVENOUS | Status: DC | PRN

## 2012-03-30 MED ORDER — ONDANSETRON HCL 4 MG/2ML IJ SOLN
4.0000 mg | Freq: Once | INTRAMUSCULAR | Status: AC
  Administered 2012-03-30: 4 mg via INTRAVENOUS

## 2012-03-30 MED ORDER — ONDANSETRON HCL 4 MG/2ML IJ SOLN
INTRAMUSCULAR | Status: AC
  Administered 2012-03-30: 4 mg via INTRAVENOUS
  Filled 2012-03-30: qty 2

## 2012-03-30 MED ORDER — ASPIRIN 81 MG PO CHEW
324.0000 mg | CHEWABLE_TABLET | Freq: Once | ORAL | Status: AC
  Administered 2012-03-30: 324 mg via ORAL
  Filled 2012-03-30: qty 4

## 2012-03-30 NOTE — ED Notes (Signed)
Pt requested crackers and coke, ok per MD. Pt given crackers and coke per request.

## 2012-03-30 NOTE — ED Notes (Signed)
Pt c/o dizziness and nausea sent here from [facility] for elevated BP

## 2012-03-30 NOTE — ED Provider Notes (Signed)
History     CSN: [id number]  Arrival date & time 03/30/12  4098   First MD Initiated Contact with Patient 03/30/12 1934      Chief Complaint  Patient presents with  . Dizziness    (Consider location/radiation/quality/duration/timing/severity/associated sxs/prior treatment) HPI Comments: Patient sent from urgent care with elevated blood pressure intermittent chest pain. Patient states she normally has a blood pressure in the 90/60 range that she's had elevated pressures over the past 2 weeks and 160/70 range. She describes episodes of substernal sharp chest pain that came on at rest last week that lasted 30 minutes at a time. Today she had a different sharp pain in the right side of her chest it lasts a few minutes and is now resolved. She states all day she's had nausea and lightheadedness but denies vertigo. No difficulty breathing, sweating, chills, fever or cough. She reports her doctor does not listen to her. She says her last stress test was many years ago. She denies any headache, visual change, back pain, focal weakness, numbness or tingling.  The history is provided by the patient.    Past Medical History  Diagnosis Date  . Postural tremor   . Thyroid disease   . Brain tumor   . Depression   . Skin cancer   . Arthritis     Past Surgical History  Procedure Date  . Abdominal hysterectomy   . Thyroid surgery   . Back surgery   . Brain surgery   . Rotator cuff repair   . Ankle surgery   . Tonsillectomy   . Adenoidectomy   . Cervical spine surgery   . Lumbar spine surgery     History reviewed. No pertinent family history.  History  Substance Use Topics  . Smoking status: Never Smoker   . Smokeless tobacco: Never Used  . Alcohol Use: No    OB History    Grav Para Term Preterm Abortions TAB SAB Ect Mult Living                  Review of Systems  Constitutional: Negative for fever, activity change and appetite change.  Eyes: Negative for visual  disturbance.  Respiratory: Positive for chest tightness and shortness of breath. Negative for cough.   Cardiovascular: Positive for chest pain.  Gastrointestinal: Negative for nausea, vomiting and abdominal pain.  Genitourinary: Negative for dysuria and hematuria.  Musculoskeletal: Negative for back pain.  Skin: Negative for rash.  Neurological: Positive for dizziness and light-headedness. Negative for weakness and headaches.    Allergies  Sulfa antibiotics; Codeine; and Tetracyclines & related  Home Medications   Current Outpatient Rx  Name  Route  Sig  Dispense  Refill  . CLONAZEPAM 2 MG PO TABS   Oral   Take 4 mg by mouth 3 (three) times daily.         Marland Kitchen EPINEPHRINE 0.3 MG/0.3ML IJ DEVI   Intramuscular   Inject 0.3 mg into the muscle once.         Marland Kitchen PREMARIN PO   Oral   Take 1 tablet by mouth daily.          . IBUPROFEN 200 MG PO TABS   Oral   Take 800 mg by mouth every 8 (eight) hours as needed. For pain         . SYNTHROID PO   Oral   Take 1 tablet by mouth daily.          Marland Kitchen PROPRANOLOL HCL  ER PO   Oral   Take 1 tablet by mouth daily.         . EFFEXOR XR PO   Oral   Take 1 capsule by mouth daily.           BP 153/101  Pulse 85  Temp 98.4 F (36.9 C) (Oral)  Resp 18  Ht 5\' 4"  (1.626 m)  Wt 197 lb (89.359 kg)  BMI 33.82 kg/m2  SpO2 100%  Physical Exam  Constitutional: She is oriented to person, place, and time. She appears well-developed. No distress.       Anxious, upset that I turned off the TV  HENT:  Head: Normocephalic and atraumatic.  Mouth/Throat: Oropharynx is clear and moist. No oropharyngeal exudate.  Eyes: Conjunctivae normal and EOM are normal. Pupils are equal, round, and reactive to light.  Neck: Normal range of motion. Neck supple.  Cardiovascular: Normal rate, regular rhythm and normal heart sounds.   No murmur heard. Pulmonary/Chest: Breath sounds normal. No respiratory distress. She exhibits no tenderness.   Abdominal: Soft. There is no tenderness. There is no rebound and no guarding.  Musculoskeletal: Normal range of motion. She exhibits no edema and no tenderness.  Neurological: She is alert and oriented to person, place, and time. No cranial nerve deficit. She exhibits normal muscle tone. Coordination normal.       5/5 strength throughout, CN 2-12 intact, no ataxia on finger to nose, no nystagmus, normal gait.    ED Course  Procedures (including critical care time)  Labs Reviewed  CBC WITH DIFFERENTIAL - Abnormal; Notable for the following:    Hemoglobin 11.6 (*)     HCT 35.3 (*)     All other components within normal limits  COMPREHENSIVE METABOLIC PANEL - Abnormal; Notable for the following:    Glucose, Bld 112 (*)     Albumin 3.1 (*)     Total Bilirubin 0.1 (*)     GFR calc non Af Amer 77 (*)     GFR calc Af Amer 89 (*)     All other components within normal limits  D-DIMER, QUANTITATIVE - Abnormal; Notable for the following:    D-Dimer, Quant 0.73 (*)     All other components within normal limits  PRO B NATRIURETIC PEPTIDE - Abnormal; Notable for the following:    Pro B Natriuretic peptide (BNP) 250.3 (*)     All other components within normal limits  TROPONIN I  TROPONIN I   Dg Chest 2 View  03/30/2012  *RADIOLOGY REPORT*  Clinical Data: Mid chest pain, dizziness, hypertension  CHEST - 2 VIEW  Comparison: 08/21/2010  Findings: Enlargement of cardiac silhouette. Tortuous aorta Upper normal size of central pulmonary arteries. Chronic peribronchial thickening without infiltrate or effusion. No pneumothorax or acute failure identified. Bones unremarkable. Post laparoscopic gastric banding.  IMPRESSION: Enlargement of cardiac silhouette. Chronic bronchitic changes.   Original Report Authenticated By: Ulyses Southward, M.D.    Ct Angio Chest Pe W/cm &/or Wo Cm  03/30/2012  *RADIOLOGY REPORT*  Clinical Data: Shortness of breath.  Chest pain.  Elevated D-dimer.  CT ANGIOGRAPHY CHEST   Technique:  Multidetector CT imaging of the chest using the standard protocol during bolus administration of intravenous contrast. Multiplanar reconstructed images including MIPs were obtained and reviewed to evaluate the vascular anatomy.  Contrast:  80 ml Omnipaque 315  Comparison: 08/21/2010  Findings: Technically adequate study with good opacification of the central renal arteries.  No focal filling defects.  No  evidence of significant pulmonary and.  Normal heart size.  Reflux of contrast material into the true hepatic IVC suggesting some evidence of passive congestion.  Normal caliber thoracic aorta.  No significant lymphadenopathy in the chest.  The esophagus is decompressed.  Postoperative change with gastric banding.  Bilateral breast implants.  No pleural effusions.  Minimal dependent changes in the lung bases.  No focal airspace consolidation.  No significant interstitial infiltration.  Airways appear patent.  No pneumothorax.  Mild degenerative changes in the thoracic spine.  IMPRESSION: No evidence of significant pulmonary embolus.   Original Report Authenticated By: Burman Nieves, M.D.      No diagnosis found.    MDM  Lightheadedness, nausea, dizziness, intermittent chest pain. Vital stable, no distress, neuro exam nonfocal  Patient is a nonfocal neuro exam, normal EKG. Troponin negative. EKG nonischemic. Her imaging chest pain is atypical for ACS. Her blood pressure has improved in the ED and she denies headache, eye pain, chest pain, shortness of breath. She is ambulatory and no longer feels nauseated or lightheaded. Orthostatics negative.  She is offered CDU chest pain rule out but she declines stating she'll followup with her doctors at [facility]. With normal EKG and 2 negative troponins I feel this is reasonable. She also need followup for her elevated blood pressure.    Date: 03/30/2012  Rate: 60  Rhythm: normal sinus rhythm  QRS Axis: normal  Intervals: normal  ST/T  Wave abnormalities: normal  Conduction Disutrbances:none  Narrative Interpretation:   Old EKG Reviewed: none available    Glynn Octave, MD 03/30/12 2348

## 2012-03-30 NOTE — ED Notes (Signed)
MD at bedside. 

## 2012-03-30 NOTE — ED Notes (Signed)
Pt reports she had some intermittent chest pain last week. Pt reports episode of chest pain today described as sharp, midsternal. Pt states pain resolved without intervention. Pt went to PMD x 2 weeks ago for elevated blood pressure. Pt was seen at [facility] today for c/o light headed and nausea. Pt denies any chest pain at this time.

## 2012-03-30 NOTE — ED Notes (Signed)
Patient transported to X-ray 

## 2012-03-30 NOTE — ED Notes (Signed)
Pt states blood pressure has been trending up x several weeks. Pt states PMD "just bitched at me" due to excessive amount of klonipin she takes for her tremors.

## 2012-12-08 ENCOUNTER — Ambulatory Visit (HOSPITAL_BASED_OUTPATIENT_CLINIC_OR_DEPARTMENT_OTHER)
Admission: RE | Admit: 2012-12-08 | Discharge: 2012-12-08 | Disposition: A | Discharge status: Home / Self Care | Payer: Medicare Other | Source: Ambulatory Visit | Attending: Internal Medicine | Admitting: Internal Medicine

## 2012-12-08 ENCOUNTER — Other Ambulatory Visit (HOSPITAL_BASED_OUTPATIENT_CLINIC_OR_DEPARTMENT_OTHER): Payer: Self-pay | Admitting: Internal Medicine

## 2012-12-08 ENCOUNTER — Ambulatory Visit (HOSPITAL_BASED_OUTPATIENT_CLINIC_OR_DEPARTMENT_OTHER): Payer: BC Managed Care – PPO

## 2012-12-08 DIAGNOSIS — R269 Unspecified abnormalities of gait and mobility: Secondary | ICD-10-CM | POA: Insufficient documentation

## 2012-12-08 DIAGNOSIS — R251 Tremor, unspecified: Secondary | ICD-10-CM

## 2012-12-08 DIAGNOSIS — H539 Unspecified visual disturbance: Secondary | ICD-10-CM | POA: Insufficient documentation

## 2012-12-08 DIAGNOSIS — R531 Weakness: Secondary | ICD-10-CM

## 2012-12-08 DIAGNOSIS — M6281 Muscle weakness (generalized): Secondary | ICD-10-CM | POA: Insufficient documentation

## 2012-12-08 DIAGNOSIS — R29898 Other symptoms and signs involving the musculoskeletal system: Secondary | ICD-10-CM | POA: Insufficient documentation

## 2012-12-08 DIAGNOSIS — R42 Dizziness and giddiness: Secondary | ICD-10-CM

## 2012-12-08 DIAGNOSIS — R259 Unspecified abnormal involuntary movements: Secondary | ICD-10-CM | POA: Insufficient documentation

## 2015-04-08 ENCOUNTER — Institutional Professional Consult (permissible substitution): Payer: Medicare Other | Admitting: Neurology

## 2015-04-29 ENCOUNTER — Institutional Professional Consult (permissible substitution): Payer: Medicare Other | Admitting: Neurology

## 2015-05-16 ENCOUNTER — Institutional Professional Consult (permissible substitution): Payer: Medicare Other | Admitting: Neurology

## 2017-02-05 ENCOUNTER — Encounter (HOSPITAL_BASED_OUTPATIENT_CLINIC_OR_DEPARTMENT_OTHER): Payer: Self-pay | Admitting: *Deleted

## 2017-02-05 ENCOUNTER — Emergency Department (HOSPITAL_BASED_OUTPATIENT_CLINIC_OR_DEPARTMENT_OTHER): Payer: Medicare Other

## 2017-02-05 ENCOUNTER — Emergency Department (HOSPITAL_BASED_OUTPATIENT_CLINIC_OR_DEPARTMENT_OTHER)
Admission: EM | Admit: 2017-02-05 | Discharge: 2017-02-05 | Disposition: A | Discharge status: Home / Self Care | Payer: Medicare Other | Attending: Emergency Medicine | Admitting: Emergency Medicine

## 2017-02-05 DIAGNOSIS — Z85828 Personal history of other malignant neoplasm of skin: Secondary | ICD-10-CM | POA: Diagnosis not present

## 2017-02-05 DIAGNOSIS — Z79899 Other long term (current) drug therapy: Secondary | ICD-10-CM | POA: Insufficient documentation

## 2017-02-05 DIAGNOSIS — W010XXA Fall on same level from slipping, tripping and stumbling without subsequent striking against object, initial encounter: Secondary | ICD-10-CM | POA: Insufficient documentation

## 2017-02-05 DIAGNOSIS — I10 Essential (primary) hypertension: Secondary | ICD-10-CM | POA: Insufficient documentation

## 2017-02-05 DIAGNOSIS — Y929 Unspecified place or not applicable: Secondary | ICD-10-CM | POA: Diagnosis not present

## 2017-02-05 DIAGNOSIS — S4981XA Other specified injuries of right shoulder and upper arm, initial encounter: Secondary | ICD-10-CM | POA: Diagnosis present

## 2017-02-05 DIAGNOSIS — Y939 Activity, unspecified: Secondary | ICD-10-CM | POA: Insufficient documentation

## 2017-02-05 DIAGNOSIS — Y999 Unspecified external cause status: Secondary | ICD-10-CM | POA: Diagnosis not present

## 2017-02-05 DIAGNOSIS — S42294A Other nondisplaced fracture of upper end of right humerus, initial encounter for closed fracture: Secondary | ICD-10-CM

## 2017-02-05 HISTORY — DX: Essential (primary) hypertension: I10

## 2017-02-05 MED ORDER — HYDROMORPHONE HCL 4 MG PO TABS: 4.0000 mg | ORAL_TABLET | ORAL | 0 refills | Status: DC | PRN

## 2017-02-05 MED ORDER — ONDANSETRON 4 MG PO TBDP
4.0000 mg | ORAL_TABLET | Freq: Once | ORAL | Status: AC
  Administered 2017-02-05: 4 mg via ORAL
  Filled 2017-02-05: qty 1

## 2017-02-05 MED ORDER — HYDROMORPHONE HCL 1 MG/ML IJ SOLN
1.0000 mg | Freq: Once | INTRAMUSCULAR | Status: AC
  Administered 2017-02-05: 1 mg via INTRAMUSCULAR
  Filled 2017-02-05: qty 1

## 2017-02-05 MED ORDER — ONDANSETRON 4 MG PO TBDP: ORAL_TABLET | ORAL | 0 refills | Status: DC

## 2017-02-05 MED FILL — ONDANSETRON ODT 4 MG TABLET: 4 | 4 days supply | Qty: 10 | Fill #0

## 2017-02-05 MED FILL — HYDROmorphone HCL 4 MG TABS: 4 | 3 days supply | Qty: 10 | Fill #0

## 2017-02-05 NOTE — ED Provider Notes (Signed)
[facility] EMERGENCY DEPARTMENT Provider Note   CSN: [id number] Arrival date & time: 02/05/17  1242     History   Chief Complaint Chief Complaint  Patient presents with  . Fall    HPI Lynn Santos is a 66 y.o. female history of hypertension, previous right rotator cuff surgery here presenting with fall with right shoulder pain.patient states that she tripped over a box this morning and landed directly on the right shoulder. Denies any head injury or loss of consciousness or chest pain. Family made a sling and she was wearing a sling on arrival. Had previous right shoulder rotator cuff repair at [place].   The history is provided by the patient.    Past Medical History:  Diagnosis Date  . Arthritis   . Brain tumor ([place])   . Depression   . Hypertension   . Postural tremor   . Skin cancer   . Thyroid disease     There are no active problems to display for this patient.   Past Surgical History:  Procedure Laterality Date  . ABDOMINAL HYSTERECTOMY    . ADENOIDECTOMY    . ANKLE SURGERY    . BACK SURGERY    . BRAIN SURGERY    . CERVICAL SPINE SURGERY    . DEEP BRAIN STIMULATOR PLACEMENT    . LUMBAR SPINE SURGERY    . ROTATOR CUFF REPAIR    . THYROID SURGERY    . TONSILLECTOMY      OB History    No data available       Home Medications    Prior to Admission medications   Medication Sig Start Date End Date Taking? Authorizing Provider  clonazePAM (KLONOPIN) 2 MG tablet Take 4 mg by mouth 3 (three) times daily.   Yes [provider]  Estrogens Conjugated (PREMARIN PO) Take 1 tablet by mouth daily.    Yes [provider]  ibuprofen (ADVIL,MOTRIN) 200 MG tablet Take 800 mg by mouth every 8 (eight) hours as needed. For pain   Yes [provider]  Levothyroxine Sodium (SYNTHROID PO) Take 1 tablet by mouth daily.    Yes [provider]  LISINOPRIL PO Take by mouth.   Yes [provider]    PROPRANOLOL HCL ER PO Take 1 tablet by mouth daily.   Yes [provider]  Venlafaxine HCl (EFFEXOR XR PO) Take 1 capsule by mouth daily.   Yes [provider]  EPINEPHrine (EPIPEN) 0.3 mg/0.3 mL DEVI Inject 0.3 mg into the muscle once.    [provider]    Family History No family history on file.  Social History Social History  Substance Use Topics  . Smoking status: Never Smoker  . Smokeless tobacco: Never Used  . Alcohol use No     Allergies   Sulfa antibiotics; Codeine; and Tetracyclines & related   Review of Systems Review of Systems  Musculoskeletal:       R shoulder pain   All other systems reviewed and are negative.    Physical Exam Updated Vital Signs BP (!) 185/90 (BP Location: Left Arm)   Pulse 60   Temp 98.2 F (36.8 C) (Oral)   Resp 18   Ht 5\' 5"  (1.651 m)   Wt 94.3 kg (208 lb)   SpO2 98%   BMI 34.61 kg/m   Physical Exam  Constitutional: She is oriented to person, place, and time.  Uncomfortable   HENT:  Head: Normocephalic and atraumatic.  Eyes: Pupils are equal, round, and reactive to light. Conjunctivae and EOM are normal.  Neck: Normal range of motion. Neck supple.  Cardiovascular: Normal rate, regular rhythm and normal heart sounds.   Pulmonary/Chest: Effort normal and breath sounds normal. No respiratory distress. She has no wheezes. She has no rales.  Abdominal: Soft. Bowel sounds are normal. She exhibits no distension. There is no tenderness.  Musculoskeletal:  Dec ROM R shoulder. Tenderness R proximal humerus with some swelling but no obvious deformity. Able to range R elbow and no forearm tenderness. 2 + radial pulse, able to hand grasp. No midline spinal tenderness   Neurological: She is alert and oriented to person, place, and time. No cranial nerve deficit. Coordination normal.  Skin: Skin is warm.  Psychiatric: She has a normal mood and affect.  Nursing note and vitals reviewed.    ED Treatments /  Results  Labs (all labs ordered are listed, but only abnormal results are displayed) Labs Reviewed - No data to display  EKG  EKG Interpretation None       Radiology Dg Humerus Right  Result Date: 02/05/2017 CLINICAL DATA:  Status post fall.  Weakness in the legs. EXAM: RIGHT HUMERUS - 2+ VIEW COMPARISON:  None. FINDINGS: Nondisplaced fracture of the right greater tuberosity. No other fracture or dislocation. Normal acromioclavicular joint. IMPRESSION: 1. Nondisplaced fracture of the right greater tuberosity. Electronically Signed   By: Kathreen Devoid   On: 02/05/2017 13:27    Procedures Procedures (including critical care time)  Medications Ordered in ED Medications  HYDROmorphone (DILAUDID) injection 1 mg (1 mg Intramuscular Given 02/05/17 1332)  ondansetron (ZOFRAN-ODT) disintegrating tablet 4 mg (4 mg Oral Given 02/05/17 1405)     Initial Impression / Assessment and Plan / ED Course  I have reviewed the triage vital signs and the nursing notes.  Pertinent labs & imaging results that were available during my care of the patient were reviewed by me and considered in my medical decision making (see chart for details).    Lynn Santos is a 66 y.o. female here with fall with R shoulder pain. Had previous rotator cuff surgery. Patient hypertensive but has hx of HTN and is in severe pain. Has no head injury so will not need CT head.   2:18 PM Xray showed R greater tuberosity fracture. Pain improved with dilaudid, BP also improved. Will dc home with dilaudid (had multiple drug allergies to other pain meds), zofran prn. Has ortho follow up already    Final Clinical Impressions(s) / ED Diagnoses   Final diagnoses:  None    New Prescriptions New Prescriptions   No medications on file     Drenda Freeze, MD 02/05/17 1419

## 2017-02-05 NOTE — ED Triage Notes (Signed)
She tripped over a box this am. Injury to her right upper arm. Pain goes all the way down her arm. She is wearing a sling at triage.

## 2017-02-05 NOTE — Discharge Instructions (Signed)
Take dilaudid with zofran for pain.   Take motrin for mild pain,.   Apply ice to help with swelling.   See your orthopedic doctor.   Wear sling for comfort  Return to ER if you have worse shoulder swelling or pain

## 2017-02-27 ENCOUNTER — Emergency Department (HOSPITAL_BASED_OUTPATIENT_CLINIC_OR_DEPARTMENT_OTHER)
Admission: EM | Admit: 2017-02-27 | Discharge: 2017-02-28 | Disposition: A | Discharge status: Home / Self Care | Payer: Medicare Other | Attending: Emergency Medicine | Admitting: Emergency Medicine

## 2017-02-27 ENCOUNTER — Other Ambulatory Visit: Payer: Self-pay

## 2017-02-27 ENCOUNTER — Encounter (HOSPITAL_BASED_OUTPATIENT_CLINIC_OR_DEPARTMENT_OTHER): Payer: Self-pay | Admitting: Emergency Medicine

## 2017-02-27 ENCOUNTER — Emergency Department (HOSPITAL_BASED_OUTPATIENT_CLINIC_OR_DEPARTMENT_OTHER): Payer: Medicare Other

## 2017-02-27 DIAGNOSIS — Y939 Activity, unspecified: Secondary | ICD-10-CM | POA: Insufficient documentation

## 2017-02-27 DIAGNOSIS — Z85828 Personal history of other malignant neoplasm of skin: Secondary | ICD-10-CM | POA: Diagnosis not present

## 2017-02-27 DIAGNOSIS — W19XXXA Unspecified fall, initial encounter: Secondary | ICD-10-CM

## 2017-02-27 DIAGNOSIS — S60212A Contusion of left wrist, initial encounter: Secondary | ICD-10-CM | POA: Diagnosis not present

## 2017-02-27 DIAGNOSIS — Y929 Unspecified place or not applicable: Secondary | ICD-10-CM | POA: Diagnosis not present

## 2017-02-27 DIAGNOSIS — I10 Essential (primary) hypertension: Secondary | ICD-10-CM | POA: Insufficient documentation

## 2017-02-27 DIAGNOSIS — Y998 Other external cause status: Secondary | ICD-10-CM | POA: Insufficient documentation

## 2017-02-27 DIAGNOSIS — W0110XA Fall on same level from slipping, tripping and stumbling with subsequent striking against unspecified object, initial encounter: Secondary | ICD-10-CM | POA: Diagnosis not present

## 2017-02-27 DIAGNOSIS — Z9181 History of falling: Secondary | ICD-10-CM | POA: Insufficient documentation

## 2017-02-27 DIAGNOSIS — S8992XA Unspecified injury of left lower leg, initial encounter: Secondary | ICD-10-CM | POA: Diagnosis not present

## 2017-02-27 DIAGNOSIS — Z79899 Other long term (current) drug therapy: Secondary | ICD-10-CM | POA: Diagnosis not present

## 2017-02-27 MED ORDER — OXYCODONE-ACETAMINOPHEN 5-325 MG PO TABS
1.0000 | ORAL_TABLET | Freq: Once | ORAL | Status: AC
  Administered 2017-02-27: 1 via ORAL
  Filled 2017-02-27: qty 1

## 2017-02-27 NOTE — ED Triage Notes (Signed)
PT presents With c/o left leg pain after a fall today.

## 2017-02-27 NOTE — ED Provider Notes (Addendum)
[facility] DE[facility] Provider Note: Georgena Spurling, MD, FACEP  CSN: [id number] MRN: [id number] ARRIVAL: 02/27/17 at [place]: [place]  Fall   HISTORY OF PRESENT ILLNESS  02/27/17 11:06 PM Lynn Santos is a 66 y.o. female with a history of frequent falls due to neuropathy.  She tripped and fell earlier today about noon.  She fell onto her left knee and her left wrist.  Her right upper extremity is already immobilized due to recent humerus fracture.  There was no significant pain immediately but she has subsequently developed severe pain in her left knee which she rates as a 10 out of 10.  It is worse with movement or palpation.  She is minimally able to bear weight on it.  There is associated swelling.  She also has ecchymosis to her volar left wrist but without significant pain.  She denies other acute injury.  She states her tetanus is up-to-date.  Consultation with the [facility] state controlled substances database reveals the patient has received one prescription for oxycodone, one prescription for hydrocodone more phone in one prescription for tramadol in the past year..   Past Medical History:  Diagnosis Date  . Arthritis   . Brain tumor ([place])   . Depression   . Hypertension   . Postural tremor   . Skin cancer   . Thyroid disease     Past Surgical History:  Procedure Laterality Date  . ABDOMINAL HYSTERECTOMY    . ADENOIDECTOMY    . ANKLE SURGERY    . BACK SURGERY    . BRAIN SURGERY    . CERVICAL SPINE SURGERY    . DEEP BRAIN STIMULATOR PLACEMENT    . LUMBAR SPINE SURGERY    . ROTATOR CUFF REPAIR    . THYROID SURGERY    . TONSILLECTOMY      No family history on file.  Social History   Tobacco Use  . Smoking status: Never Smoker  . Smokeless tobacco: Never Used  Substance Use Topics  . Alcohol use: No  . Drug use: No    Prior to Admission medications   Medication Sig Start Date End Date Taking? Authorizing Provider    clonazePAM (KLONOPIN) 2 MG tablet Take 4 mg by mouth 3 (three) times daily.    [provider]  EPINEPHrine (EPIPEN) 0.3 mg/0.3 mL DEVI Inject 0.3 mg into the muscle once.    [provider]  Estrogens Conjugated (PREMARIN PO) Take 1 tablet by mouth daily.     [provider]  HYDROmorphone (DILAUDID) 4 MG tablet Take 1 tablet (4 mg total) by mouth every 4 (four) hours as needed for severe pain. 02/05/17   Drenda Freeze, MD  ibuprofen (ADVIL,MOTRIN) 200 MG tablet Take 800 mg by mouth every 8 (eight) hours as needed. For pain    [provider]  Levothyroxine Sodium (SYNTHROID PO) Take 1 tablet by mouth daily.     [provider]  LISINOPRIL PO Take by mouth.    [provider]  ondansetron (ZOFRAN ODT) 4 MG disintegrating tablet 4mg  ODT q6 hours prn nausea/vomit 02/05/17   Drenda Freeze, MD  PROPRANOLOL HCL ER PO Take 1 tablet by mouth daily.    [provider]  Venlafaxine HCl (EFFEXOR XR PO) Take 1 capsule by mouth daily.    [provider]    Allergies Sulfa antibiotics; Codeine; and Tetracyclines & related   REVIEW OF SYSTEMS  Negative except as noted  here or in the History of Present Illness.   PHYSICAL EXAMINATION  Initial Vital Signs Blood pressure (!) 200/95, pulse 99, temperature 98 F (36.7 C), temperature source Oral, resp. rate 18, SpO2 100 %.  Examination General: Well-developed, well-nourished female in no acute distress; appearance consistent with age of record HENT: normocephalic; atraumatic Eyes: pupils equal, round and reactive to light; extraocular muscles intact Neck: supple; no C-spine tenderness Heart: regular rate and rhythm Lungs: clear to auscultation bilaterally Abdomen: soft; nondistended; nontender; bowel sounds present Extremities: No deformity; swelling and tenderness of left knee with decreased range of motion; ecchymosis of left proximal hyperthenar eminence without  tenderness; pulses normal; trace edema of lower legs Neurologic: Awake, alert and oriented; motor function intact in all extremities and symmetric; no facial droop Skin: Warm and dry Psychiatric: Normal mood and affect   RESULTS  Summary of this visit's results, reviewed by myself:   EKG Interpretation  Date/Time:    Ventricular Rate:    PR Interval:    QRS Duration:   QT Interval:    QTC Calculation:   R Axis:     Text Interpretation:        Laboratory Studies: No results found for this or any previous visit (from the past 24 hour(s)). Imaging Studies: Dg Knee Complete 4 Views Left  Result Date: 02/27/2017 CLINICAL DATA:  Fall with left knee pain EXAM: LEFT KNEE - COMPLETE 4+ VIEW COMPARISON:  None. FINDINGS: Diffuse soft tissue swelling. No effusion. No definite acute displaced fracture or malalignment is seen. IMPRESSION: Soft tissue swelling.  No definite acute osseous abnormality Electronically Signed   By: Donavan Foil M.D.   On: 02/27/2017 23:43    ED COURSE  Nursing notes and initial vitals signs, including pulse oximetry, reviewed.  Vitals:   02/27/17 2303  BP: (!) 200/95  Pulse: 99  Resp: 18  Temp: 98 F (36.7 C)  TempSrc: Oral  SpO2: 100%   The patient has an established orthopedic surgeon with whom she can follow-up.  PROCEDURES    ED DIAGNOSES     ICD-10-CM   1. Fall, initial encounter W19.XXXA   2. Left knee injury, initial encounter S89.92XA   3. Contusion of left wrist, initial encounter W10.932T        Shanon Rosser, MD 02/27/17 2359    Shanon Rosser, MD 02/28/17 0002    Analese Sovine, Jenny Reichmann, MD 02/28/17 5573

## 2017-02-28 DIAGNOSIS — S8992XA Unspecified injury of left lower leg, initial encounter: Secondary | ICD-10-CM | POA: Diagnosis not present

## 2017-02-28 MED ORDER — OXYCODONE-ACETAMINOPHEN 5-325 MG PO TABS: 1.0000 | ORAL_TABLET | Freq: Four times a day (QID) | ORAL | 0 refills | Status: DC | PRN

## 2017-04-14 ENCOUNTER — Emergency Department (HOSPITAL_BASED_OUTPATIENT_CLINIC_OR_DEPARTMENT_OTHER)
Admission: EM | Admit: 2017-04-14 | Discharge: 2017-04-14 | Disposition: A | Discharge status: Other Acute Inpatient Hospital | Payer: Medicare Other | Attending: Emergency Medicine | Admitting: Emergency Medicine

## 2017-04-14 ENCOUNTER — Encounter (HOSPITAL_COMMUNITY): Payer: Self-pay | Admitting: *Deleted

## 2017-04-14 ENCOUNTER — Other Ambulatory Visit: Payer: Self-pay

## 2017-04-14 ENCOUNTER — Encounter (HOSPITAL_BASED_OUTPATIENT_CLINIC_OR_DEPARTMENT_OTHER): Payer: Self-pay | Admitting: *Deleted

## 2017-04-14 ENCOUNTER — Inpatient Hospital Stay (HOSPITAL_COMMUNITY)
Admission: AD | Admit: 2017-04-14 | Discharge: 2017-04-17 | DRG: 885 | Disposition: A | Discharge status: Home / Self Care | Payer: Medicare Other | Source: Intra-hospital | Attending: Psychiatry | Admitting: Psychiatry

## 2017-04-14 DIAGNOSIS — M2550 Pain in unspecified joint: Secondary | ICD-10-CM | POA: Diagnosis not present

## 2017-04-14 DIAGNOSIS — F329 Major depressive disorder, single episode, unspecified: Secondary | ICD-10-CM | POA: Diagnosis present

## 2017-04-14 DIAGNOSIS — I10 Essential (primary) hypertension: Secondary | ICD-10-CM | POA: Diagnosis present

## 2017-04-14 DIAGNOSIS — Z046 Encounter for general psychiatric examination, requested by authority: Secondary | ICD-10-CM | POA: Diagnosis not present

## 2017-04-14 DIAGNOSIS — Z813 Family history of other psychoactive substance abuse and dependence: Secondary | ICD-10-CM | POA: Diagnosis not present

## 2017-04-14 DIAGNOSIS — Z90710 Acquired absence of both cervix and uterus: Secondary | ICD-10-CM

## 2017-04-14 DIAGNOSIS — Z7984 Long term (current) use of oral hypoglycemic drugs: Secondary | ICD-10-CM | POA: Diagnosis not present

## 2017-04-14 DIAGNOSIS — Z79899 Other long term (current) drug therapy: Secondary | ICD-10-CM

## 2017-04-14 DIAGNOSIS — Z818 Family history of other mental and behavioral disorders: Secondary | ICD-10-CM

## 2017-04-14 DIAGNOSIS — Z885 Allergy status to narcotic agent status: Secondary | ICD-10-CM

## 2017-04-14 DIAGNOSIS — R251 Tremor, unspecified: Secondary | ICD-10-CM | POA: Diagnosis present

## 2017-04-14 DIAGNOSIS — F332 Major depressive disorder, recurrent severe without psychotic features: Secondary | ICD-10-CM | POA: Diagnosis present

## 2017-04-14 DIAGNOSIS — G4710 Hypersomnia, unspecified: Secondary | ICD-10-CM | POA: Diagnosis present

## 2017-04-14 DIAGNOSIS — Z915 Personal history of self-harm: Secondary | ICD-10-CM | POA: Diagnosis not present

## 2017-04-14 DIAGNOSIS — F419 Anxiety disorder, unspecified: Secondary | ICD-10-CM | POA: Diagnosis present

## 2017-04-14 DIAGNOSIS — R296 Repeated falls: Secondary | ICD-10-CM | POA: Diagnosis present

## 2017-04-14 DIAGNOSIS — M549 Dorsalgia, unspecified: Secondary | ICD-10-CM | POA: Diagnosis not present

## 2017-04-14 DIAGNOSIS — Z881 Allergy status to other antibiotic agents status: Secondary | ICD-10-CM | POA: Diagnosis not present

## 2017-04-14 DIAGNOSIS — Z85828 Personal history of other malignant neoplasm of skin: Secondary | ICD-10-CM | POA: Diagnosis not present

## 2017-04-14 DIAGNOSIS — Z811 Family history of alcohol abuse and dependence: Secondary | ICD-10-CM | POA: Diagnosis not present

## 2017-04-14 DIAGNOSIS — R45851 Suicidal ideations: Secondary | ICD-10-CM | POA: Insufficient documentation

## 2017-04-14 LAB — COMPREHENSIVE METABOLIC PANEL
ALT: 9 U/L — ABNORMAL LOW (ref 14–54)
AST: 15 U/L (ref 15–41)
Albumin: 3.4 g/dL — ABNORMAL LOW (ref 3.5–5.0)
Alkaline Phosphatase: 86 U/L (ref 38–126)
Anion gap: 8 (ref 5–15)
BUN: 14 mg/dL (ref 6–20)
CO2: 29 mmol/L (ref 22–32)
Calcium: 8.2 mg/dL — ABNORMAL LOW (ref 8.9–10.3)
Chloride: 101 mmol/L (ref 101–111)
Creatinine, Ser: 0.97 mg/dL (ref 0.44–1.00)
GFR calc Af Amer: >60 mL/min (ref >60)
GFR calc non Af Amer: 59 mL/min — ABNORMAL LOW (ref >60)
Glucose, Bld: 92 mg/dL (ref 65–99)
Potassium: 3.7 mmol/L (ref 3.5–5.1)
Sodium: 138 mmol/L (ref 135–145)
Total Bilirubin: 0.4 mg/dL (ref 0.3–1.2)
Total Protein: 8.2 g/dL — ABNORMAL HIGH (ref 6.5–8.1)

## 2017-04-14 LAB — CBC
HCT: 38.1 % (ref 36.0–46.0)
Hemoglobin: 12 g/dL (ref 12.0–15.0)
MCH: 29.8 pg (ref 26.0–34.0)
MCHC: 31.5 g/dL (ref 30.0–36.0)
MCV: 94.5 fL (ref 78.0–100.0)
Platelets: 275 10*3/uL (ref 150–400)
RBC: 4.03 MIL/uL (ref 3.87–5.11)
RDW: 14.1 % (ref 11.5–15.5)
WBC: 6.9 10*3/uL (ref 4.0–10.5)

## 2017-04-14 LAB — ACETAMINOPHEN LEVEL: Acetaminophen (Tylenol), Serum: <10 ug/mL — ABNORMAL LOW (ref 10–30)

## 2017-04-14 LAB — SALICYLATE LEVEL: Salicylate Lvl: <7 mg/dL (ref 2.8–30.0)

## 2017-04-14 LAB — ETHANOL: Alcohol, Ethyl (B): <10 mg/dL (ref <10)

## 2017-04-14 LAB — RAPID URINE DRUG SCREEN, HOSP PERFORMED
Amphetamines: NOT DETECTED
Barbiturates: NOT DETECTED
Benzodiazepines: POSITIVE — AB
Cocaine: NOT DETECTED
Opiates: NOT DETECTED
Tetrahydrocannabinol: NOT DETECTED

## 2017-04-14 MED ORDER — TRAZODONE 25 MG HALF TABLET
25.0000 mg | ORAL_TABLET | Freq: Every evening | ORAL | Status: DC | PRN
  Filled 2017-04-14 (×2): qty 1

## 2017-04-14 MED ORDER — CLONAZEPAM 1 MG PO TABS
2.0000 mg | ORAL_TABLET | Freq: Three times a day (TID) | ORAL | Status: DC
  Administered 2017-04-14 – 2017-04-15 (×3): 2 mg via ORAL
  Filled 2017-04-14 (×3): qty 2

## 2017-04-14 MED ORDER — MAGNESIUM HYDROXIDE 400 MG/5ML PO SUSP: 30.0000 mL | Freq: Every day | ORAL | Status: DC | PRN

## 2017-04-14 MED ORDER — PROPRANOLOL HCL ER 60 MG PO CP24
60.0000 mg | ORAL_CAPSULE | Freq: Every day | ORAL | Status: DC
  Administered 2017-04-14 – 2017-04-15 (×2): 60 mg via ORAL
  Filled 2017-04-14 (×5): qty 1

## 2017-04-14 MED ORDER — AMLODIPINE BESYLATE 2.5 MG PO TABS
2.5000 mg | ORAL_TABLET | Freq: Every day | ORAL | Status: DC
  Filled 2017-04-14 (×4): qty 1

## 2017-04-14 MED ORDER — VENLAFAXINE HCL ER 150 MG PO CP24
150.0000 mg | ORAL_CAPSULE | Freq: Every day | ORAL | Status: DC
  Filled 2017-04-14 (×2): qty 1

## 2017-04-14 MED ORDER — ACETAMINOPHEN 325 MG PO TABS: 650.0000 mg | ORAL_TABLET | Freq: Four times a day (QID) | ORAL | Status: DC | PRN

## 2017-04-14 MED ORDER — CLONIDINE HCL 0.1 MG PO TABS
ORAL_TABLET | ORAL | Status: AC
  Administered 2017-04-14: 0.2 mg
  Filled 2017-04-14: qty 2

## 2017-04-14 MED ORDER — LEVOTHYROXINE SODIUM 137 MCG PO TABS
137.0000 ug | ORAL_TABLET | Freq: Every day | ORAL | Status: DC
  Administered 2017-04-15 – 2017-04-17 (×3): 137 ug via ORAL
  Filled 2017-04-14 (×5): qty 1

## 2017-04-14 MED ORDER — LISINOPRIL 20 MG PO TABS
20.0000 mg | ORAL_TABLET | Freq: Every evening | ORAL | Status: DC
  Administered 2017-04-14 – 2017-04-16 (×3): 20 mg via ORAL
  Filled 2017-04-14 (×6): qty 1

## 2017-04-14 MED ORDER — ESTROGENS CONJUGATED 1.25 MG PO TABS
1.2500 mg | ORAL_TABLET | Freq: Every day | ORAL | Status: DC
  Administered 2017-04-14 – 2017-04-15 (×2): 1.25 mg via ORAL
  Filled 2017-04-14 (×2): qty 1
  Filled 2017-04-14: qty 2
  Filled 2017-04-14 (×2): qty 1

## 2017-04-14 MED ORDER — VENLAFAXINE HCL ER 150 MG PO CP24
150.0000 mg | ORAL_CAPSULE | Freq: Every evening | ORAL | Status: DC
  Administered 2017-04-14 – 2017-04-16 (×3): 150 mg via ORAL
  Filled 2017-04-14 (×4): qty 1

## 2017-04-14 MED ORDER — ALUM & MAG HYDROXIDE-SIMETH 200-200-20 MG/5ML PO SUSP: 30.0000 mL | ORAL | Status: DC | PRN

## 2017-04-14 NOTE — ED Notes (Signed)
TTS machine at bedside at this time

## 2017-04-14 NOTE — ED Notes (Signed)
Pt agreed to be admitted at [facility] voluntary; pt using her phone to find dog care; pt corporative at this time

## 2017-04-14 NOTE — ED Provider Notes (Signed)
[facility] EMERGENCY DEPARTMENT Provider Note   CSN: [id number] Arrival date & time: 04/14/17  1530     History   Chief Complaint Chief Complaint  Patient presents with  . Suicidal    HPI Lynn Santos is a 66 y.o. female.  HPI Patient reports she has had severe depression and spends much of her time sleeping.  Patient reports she cannot even go about her daily activities frequently due to chronically being tired and feeling depressed.  She wakes up in the morning and just feels that things are hopeless.  She reports that [place]  She moved to be closer to her grandchildren in this area.  She reports her relationship with her daughter is strained and she almost never sees her grandchildren.  She reports that she feels like if she had a gun she would shoot herself.  She reports that when she goes to Dr., she wishes that she would be diagnosed with advanced cancer with only a short period left to live. Past Medical History:  Diagnosis Date  . Arthritis   . Brain tumor ([place])   . Depression   . Hypertension   . Postural tremor   . Skin cancer   . Thyroid disease     There are no active problems to display for this patient.   Past Surgical History:  Procedure Laterality Date  . ABDOMINAL HYSTERECTOMY    . ADENOIDECTOMY    . ANKLE SURGERY    . BACK SURGERY    . BRAIN SURGERY    . CERVICAL SPINE SURGERY    . DEEP BRAIN STIMULATOR PLACEMENT    . LUMBAR SPINE SURGERY    . ROTATOR CUFF REPAIR    . THYROID SURGERY    . TONSILLECTOMY      OB History    No data available       Home Medications    Prior to Admission medications   Medication Sig Start Date End Date Taking? Authorizing Provider  clonazePAM (KLONOPIN) 2 MG tablet Take 4 mg by mouth 3 (three) times daily.    [provider]  EPINEPHrine (EPIPEN) 0.3 mg/0.3 mL DEVI Inject 0.3 mg into the muscle once.    [provider]  Estrogens Conjugated (PREMARIN PO) Take 1 tablet by mouth daily.     [provider]  ibuprofen (ADVIL,MOTRIN) 200 MG tablet Take 800 mg by mouth every 8 (eight) hours as needed. For pain    [provider]  Levothyroxine Sodium (SYNTHROID PO) Take 1 tablet by mouth daily.     [provider]  LISINOPRIL PO Take by mouth.    [provider]  ondansetron (ZOFRAN ODT) 4 MG disintegrating tablet 4mg  ODT q6 hours prn nausea/vomit 02/05/17   Drenda Freeze, MD  oxyCODONE-acetaminophen (PERCOCET) 5-325 MG tablet Take 1 tablet every 6 (six) hours as needed by mouth for severe pain. 02/28/17   Molpus, John, MD  PROPRANOLOL HCL ER PO Take 1 tablet by mouth daily.    [provider]  Venlafaxine HCl (EFFEXOR XR PO) Take 1 capsule by mouth daily.    [provider]    Family History History reviewed. No pertinent family history.  Social History Social History   Tobacco Use  . Smoking status: Never Smoker  . Smokeless tobacco: Never Used  Substance Use Topics  . Alcohol use: No  . Drug use: No  Allergies   Sulfa antibiotics; Codeine; and Tetracyclines & related   Review of Systems Review of Systems 10 Systems reviewed and are negative for acute change except as noted in the HPI.   Physical Exam Updated Vital Signs BP (!) 199/100 (BP Location: Left Arm)   Pulse (!) 58   Temp 98.3 F (36.8 C) (Oral)   Resp 18   Ht 5\' 4"  (1.626 m)   Wt 92.5 kg (204 lb)   SpO2 99%   BMI 35.02 kg/m   Physical Exam  Constitutional: She is oriented to person, place, and time. She appears well-developed and well-nourished. No distress.  HENT:  Head: Normocephalic and atraumatic.  Nose: Nose normal.  Mouth/Throat: Oropharynx is clear and moist.  Eyes: Conjunctivae and EOM are normal.  Neck: Neck supple.  Cardiovascular: Normal rate, regular rhythm and normal heart sounds.  No murmur heard. Pulmonary/Chest: Effort normal  and breath sounds normal. No respiratory distress.  Abdominal: Soft. She exhibits no distension. There is no tenderness. There is no guarding.  Musculoskeletal: Normal range of motion. She exhibits no edema or tenderness.  Neurological: She is alert and oriented to person, place, and time. No cranial nerve deficit. She exhibits normal muscle tone. Coordination normal.  Skin: Skin is warm and dry.  Psychiatric: She has a normal mood and affect.  Nursing note and vitals reviewed.    ED Treatments / Results  Labs (all labs ordered are listed, but only abnormal results are displayed) Labs Reviewed  COMPREHENSIVE METABOLIC PANEL - Abnormal; Notable for the following components:      Result Value   Calcium 8.2 (*)    Total Protein 8.2 (*)    Albumin 3.4 (*)    ALT 9 (*)    GFR calc non Af Amer 59 (*)    All other components within normal limits  ACETAMINOPHEN LEVEL - Abnormal; Notable for the following components:   Acetaminophen (Tylenol), Serum <10 (*)    All other components within normal limits  RAPID URINE DRUG SCREEN, HOSP PERFORMED - Abnormal; Notable for the following components:   Benzodiazepines POSITIVE (*)    All other components within normal limits  ETHANOL  SALICYLATE LEVEL  CBC    EKG  EKG Interpretation None       Radiology No results found.  Procedures Procedures (including critical care time)  Medications Ordered in ED Medications  cloNIDine (CATAPRES) 0.1 MG tablet (not administered)     Initial Impression / Assessment and Plan / ED Course  I have reviewed the triage vital signs and the nursing notes.  Pertinent labs & imaging results that were available during my care of the patient were reviewed by me and considered in my medical decision making (see chart for details).      Final Clinical Impressions(s) / ED Diagnoses   Final diagnoses:  Suicidal ideation  Severe episode of recurrent major depressive disorder, without psychotic features  ([place])   Patient clinically well in appearance.  She is alert and nontoxic.  Her mental status is clear.  Does not show any signs of confusion or responding to external stimuli.  She describes very long-standing depression with suicidal ideation.  Patient has chronic hypertension but has no signs of endorgan damage.  She will need to resume her regular lisinopril.  She has been administered clonidine in the emergency department as we do not have lisinopril to administer.  She is however stable medically with resumption of her normal medications. ED Discharge Orders  None       Charlesetta Shanks, MD 04/14/17 [phone]

## 2017-04-14 NOTE — Progress Notes (Addendum)
Pt accepted to  [facility], NP is the accepting provider.  Dr. Parke Poisson is the attending provider.  Call report to [phone]  [facility] ED notified   Pt is Voluntary.  Pt may be transported by [facility]  Pt scheduled  to arrive at [facility] after new vitals are taken and there is evidence of BP trending down.Romie Minus T. Judi Cong, MSW, [place] Disposition Clinical Social Work [phone] (cell) [phone] (office)
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
## 2017-04-14 NOTE — Progress Notes (Signed)
Patient ID: Bretta Fees, female   DOB: Apr 02, 1951, 66 y.o.   MRN: [id number] Per State regulations 482.30 this chart was reviewed for medical necessity with respect to the patient's admission/duration of stay.    Next review date: 04/16/17  Debarah Crape, BSN, RN-BC  Case Manager

## 2017-04-14 NOTE — Progress Notes (Signed)
Lynn Santos is a 66 year old female pt admitted on voluntary basis. On admission, she spoke about how she is here because she has been feeling depressed and suicidal. She spoke about how she feels impending doom, how she has no energy and how she sleeps all the time. She spoke about how her father and brother both committed suicide and reports that she feels the impending doom because of that. She denies any substance abuse issues and reports that she takes all her medications as prescribed. 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 She reports that she is married but that she lives apart from her husband. [name] was oriented to the unit and safety maintained.

## 2017-04-14 NOTE — Tx Team (Signed)
Initial Treatment Plan 04/14/2017 9:30 PM Lynn Santos ZHG:[id number]    PATIENT STRESSORS: Health problems Marital or family conflict   PATIENT STRENGTHS: Ability for insight Average or above average intelligence Capable of independent living General fund of knowledge Motivation for treatment/growth   PATIENT IDENTIFIED PROBLEMS: Depression  Suicidal thoughts "I feel impending doom every morning" "I have no energy"                     DISCHARGE CRITERIA:  Ability to meet basic life and health needs Improved stabilization in mood, thinking, and/or behavior Reduction of life-threatening or endangering symptoms to within safe limits Verbal commitment to aftercare and medication compliance  PRELIMINARY DISCHARGE PLAN: Attend aftercare/continuing care group Return to previous living arrangement  PATIENT/FAMILY INVOLVEMENT: This treatment plan has been presented to and reviewed with the patient, Lynn Santos, and/or family member, .  The patient and family have been given the opportunity to ask questions and make suggestions.  [place], [place], [place] 04/14/2017, 9:30 PM

## 2017-04-14 NOTE — ED Triage Notes (Signed)
Pt states she was told by her doctor to come here right away for a psych evaluation. Pt states she has had suicidal thoughts for over 2 years and finally told her doctor about them today. Pt states "I want to put a gun to my head on my mother's grave and blow my brains out."  Pt contracts for safety with this rn. Charge nurse notified.

## 2017-04-14 NOTE — ED Notes (Signed)
Report attempted x2 to adult unit at [facility] with no success. Will try again. AC notified of new b/p

## 2017-04-15 DIAGNOSIS — F332 Major depressive disorder, recurrent severe without psychotic features: Principal | ICD-10-CM

## 2017-04-15 DIAGNOSIS — Z818 Family history of other mental and behavioral disorders: Secondary | ICD-10-CM

## 2017-04-15 DIAGNOSIS — F419 Anxiety disorder, unspecified: Secondary | ICD-10-CM

## 2017-04-15 DIAGNOSIS — R251 Tremor, unspecified: Secondary | ICD-10-CM

## 2017-04-15 DIAGNOSIS — Z813 Family history of other psychoactive substance abuse and dependence: Secondary | ICD-10-CM

## 2017-04-15 DIAGNOSIS — Z811 Family history of alcohol abuse and dependence: Secondary | ICD-10-CM

## 2017-04-15 MED ORDER — CLONAZEPAM 1 MG PO TABS
2.0000 mg | ORAL_TABLET | Freq: Two times a day (BID) | ORAL | Status: DC
  Administered 2017-04-15 – 2017-04-17 (×4): 2 mg via ORAL
  Filled 2017-04-15 (×4): qty 2

## 2017-04-15 MED ORDER — CLONIDINE HCL 0.1 MG PO TABS
0.1000 mg | ORAL_TABLET | Freq: Once | ORAL | Status: AC
  Administered 2017-04-15: 0.1 mg via ORAL
  Filled 2017-04-15 (×2): qty 1

## 2017-04-15 NOTE — H&P (Addendum)
Psychiatric Admission Assessment Adult  Patient Identification: Lynn Santos MRN:  [id number] Date of Evaluation:  04/15/2017 Chief Complaint:  " I knew I needed help" Principal Diagnosis:  MDD, no psychotic features  Diagnosis:   Patient Active Problem List   Diagnosis Date Noted  . MDD (major depressive disorder), recurrent episode, severe ([place]) [F33.2] 04/14/2017   History of Present Illness: 66 year old female, lives alone, presented to the ED on 12/26 voluntarily. She has a history of depression ,and reports history of intermittent suicidal ideations. She feels that her depression has been worsening . She reports increase in suicidal ideations, and on admission reported thoughts of shooting self. She attributes depression in part to poor support network, lives alone, and states adult daughter unsupportive and adult son, husband live far away and are not particularly supportive either. Also reports she has been diagnosed with chronic tremors, orthostasis, resulting in frequent falls.   Associated Signs/Symptoms: Depression Symptoms:  depressed mood, anhedonia, hypersomnia, suicidal thoughts with specific plan, loss of energy/fatigue, decreased appetite, (Hypo) Manic Symptoms:  Denies  Anxiety Symptoms:  Reports she feels she worries excessively  Psychotic Symptoms:  Denies  PTSD Symptoms: Denies  Total Time spent with patient: 45 minutes  Past Psychiatric History: no prior psychiatric admissions, history of one prior suicide attempt 15 years ago by overdosing, denies history of self cutting or self injurious behaviors, denies history of psychosis, denies history of mania, denies history of PTSD.  Describes history of panic attacks in the past but not recently .  Reports history of chronic depression, which she states is " present all the time", but waxes and wanes overtime.  Is the patient at risk to self? Yes.    Has the patient been a risk to self in the past 6 months? No.   Has the patient been a risk to self within the distant past? Yes.    Is the patient a risk to others? No.  Has the patient been a risk to others in the past 6 months? No.  Has the patient been a risk to others within the distant past? No.   Prior Inpatient Therapy:  denies  Prior Outpatient Therapy:  states she is followed/treated by PCP, does not have a psychiatrist .  Alcohol Screening: 1. How often do you have a drink containing alcohol?: Never 2. How many drinks containing alcohol do you have on a typical day when you are drinking?: 1 or 2 3. How often do you have six or more drinks on one occasion?: Never AUDIT-C Score: 0 4. How often during the last year have you found that you were not able to stop drinking once you had started?: Never 5. How often during the last year have you failed to do what was normally expected from you becasue of drinking?: Never 6. How often during the last year have you needed a first drink in the morning to get yourself going after a heavy drinking session?: Never 7. How often during the last year have you had a feeling of guilt of remorse after drinking?: Never 8. How often during the last year have you been unable to remember what happened the night before because you had been drinking?: Never 9. Have you or someone else been injured as a result of your drinking?: No 10. Has a relative or friend or a doctor or another health worker been concerned about your drinking or suggested you cut down?: No Alcohol Use Disorder Identification Test Final Score (AUDIT): 0  Intervention/Follow-up: AUDIT Score <7 follow-up not indicated Substance Abuse History in the last 12 months:  Denies alcohol abuse, denies drug abuse  Consequences of Substance Abuse: Denies  Previous Psychotropic Medications: states she has been on Klonopin for many years, states she used to take 4 mgrs TID but is now down to 2 mgr TID. Has been on Effexor XR 150 mgrs QDAY , states " it really  worked for a long time, but it seems not to be working that well anymore". Denies side effects.  Psychological Evaluations:  No  Past Medical History:  Past Medical History:  Diagnosis Date  . Arthritis   . Brain tumor ([place])   . Depression   . Hypertension   . Postural tremor   . Skin cancer   . Thyroid disease     Past Surgical History:  Procedure Laterality Date  . ABDOMINAL HYSTERECTOMY    . ADENOIDECTOMY    . ANKLE SURGERY    . BACK SURGERY    . BRAIN SURGERY    . CERVICAL SPINE SURGERY    . DEEP BRAIN STIMULATOR PLACEMENT    . LUMBAR SPINE SURGERY    . ROTATOR CUFF REPAIR    . THYROID SURGERY    . TONSILLECTOMY     Family History: parents are deceased. Father committed suicide in 45. Mother died from complications of pancreatitis. Has one brother who committed suicide, and on surviving brother. Family Psychiatric  History:history of depression in family. As above, father and brother committed suicide. Father had history of opiate abuse, brother had history of alcohol abuse . Tobacco Screening: Have you used any form of tobacco in the last 30 days? 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  Social History   Substance and Sexual Activity  Alcohol Use No     Social History   Substance and Sexual Activity  Drug Use No    Additional Social History: Marital status: Separated Separated, when?: 12 years ago, we started living apart What types of issues is patient dealing with in the relationship?: "I don't know if we are even technically married." "He helps me out every now and then." "We've been married since 1979."  Additional relationship information: pt's husband lives in [place], [place].  Are you sexually active?: No What is your sexual orientation?: heterosexual Has your sexual activity been affected by drugs, alcohol, medication, or emotional  stress?: n/a  Does patient have children?: Yes How many children?: 2 How is patient's relationship with their children?: 48 yo son and 20yo daughter. "My kids Lynn Santos' want me. My daughter is especially cruel to me." 3 grandchildren.   Allergies:   Allergies  Allergen Reactions  . Sulfa Antibiotics Anaphylaxis  . Codeine Nausea And Vomiting  . Tetracyclines & Related Nausea Only   Lab Results:  Results for orders placed or performed during the hospital encounter of 04/14/17 (from the past 48 hour(s))  Rapid urine drug screen (hospital performed)     Status: Abnormal   Collection Time: 04/14/17  4:20 PM  Result Value Ref Range   Opiates NONE DETECTED NONE DETECTED   Cocaine NONE DETECTED NONE DETECTED   Benzodiazepines POSITIVE (A) NONE DETECTED   Amphetamines NONE DETECTED NONE DETECTED   Tetrahydrocannabinol NONE DETECTED NONE DETECTED   Barbiturates NONE DETECTED NONE DETECTED    Comment: (NOTE) DRUG SCREEN FOR MEDICAL PURPOSES ONLY.  IF CONFIRMATION IS NEEDED FOR ANY  PURPOSE, NOTIFY LAB WITHIN 5 DAYS. LOWEST DETECTABLE LIMITS FOR URINE DRUG SCREEN Drug Class                     Cutoff (ng/mL) Amphetamine and metabolites    1000 Barbiturate and metabolites    200 Benzodiazepine                 837 Tricyclics and metabolites     300 Opiates and metabolites        300 Cocaine and metabolites        300 THC                            50   Comprehensive metabolic panel     Status: Abnormal   Collection Time: 04/14/17  4:35 PM  Result Value Ref Range   Sodium 138 135 - 145 mmol/L   Potassium 3.7 3.5 - 5.1 mmol/L   Chloride 101 101 - 111 mmol/L   CO2 29 22 - 32 mmol/L   Glucose, Bld 92 65 - 99 mg/dL   BUN 14 6 - 20 mg/dL   Creatinine, Ser 0.97 0.44 - 1.00 mg/dL   Calcium 8.2 (L) 8.9 - 10.3 mg/dL   Total Protein 8.2 (H) 6.5 - 8.1 g/dL   Albumin 3.4 (L) 3.5 - 5.0 g/dL   AST 15 15 - 41 U/L   ALT 9 (L) 14 - 54 U/L   Alkaline Phosphatase 86 38 - 126 U/L   Total Bilirubin  0.4 0.3 - 1.2 mg/dL   GFR calc non Af Amer 59 (L) >60 mL/min   GFR calc Af Amer >60 >60 mL/min    Comment: (NOTE) The eGFR has been calculated using the CKD EPI equation. This calculation has not been validated in all clinical situations. eGFR's persistently <60 mL/min signify possible Chronic Kidney Disease.    Anion gap 8 5 - 15  Ethanol     Status: None   Collection Time: 04/14/17  4:35 PM  Result Value Ref Range   Alcohol, Ethyl (B) <29 <02 mg/dL  Salicylate level     Status: None   Collection Time: 04/14/17  4:35 PM  Result Value Ref Range   Salicylate Lvl <1.1 2.8 - 30.0 mg/dL  Acetaminophen level     Status: Abnormal   Collection Time: 04/14/17  4:35 PM  Result Value Ref Range   Acetaminophen (Tylenol), Serum <10 (L) 10 - 30 ug/mL    Comment:        THERAPEUTIC CONCENTRATIONS VARY SIGNIFICANTLY. A RANGE OF 10-30 ug/mL MAY BE AN EFFECTIVE CONCENTRATION FOR MANY PATIENTS. HOWEVER, SOME ARE BEST TREATED AT CONCENTRATIONS OUTSIDE THIS RANGE. ACETAMINOPHEN CONCENTRATIONS >150 ug/mL AT 4 HOURS AFTER INGESTION AND >50 ug/mL AT 12 HOURS AFTER INGESTION ARE OFTEN ASSOCIATED WITH TOXIC REACTIONS.   cbc     Status: None   Collection Time: 04/14/17  4:35 PM  Result Value Ref Range   WBC 6.9 4.0 - 10.5 K/uL   RBC 4.03 3.87 - 5.11 MIL/uL   Hemoglobin 12.0 12.0 - 15.0 g/dL   HCT 38.1 36.0 - 46.0 %   MCV 94.5 78.0 - 100.0 fL   MCH 29.8 26.0 - 34.0 pg   MCHC 31.5 30.0 - 36.0 g/dL   RDW 14.1 11.5 - 15.5 %   Platelets 275 150 - 400 K/uL    Blood Alcohol level:  Lab Results  Component Value Date  ETH <10 85/46/2703    Metabolic Disorder Labs:  No results found for: HGBA1C, MPG No results found for: PROLACTIN No results found for: CHOL, TRIG, HDL, CHOLHDL, VLDL, LDLCALC  Current Medications: Current Facility-Administered Medications  Medication Dose Route Frequency Provider Last Rate Last Dose  . acetaminophen (TYLENOL) tablet 650 mg  650 mg Oral Q6H PRN Laverle Hobby, PA-C      . alum & mag hydroxide-simeth (MAALOX/MYLANTA) 200-200-20 MG/5ML suspension 30 mL  30 mL Oral Q4H PRN Laverle Hobby, PA-C      . amLODipine (NORVASC) tablet 2.5 mg  2.5 mg Oral Daily Simon, Spencer E, PA-C      . clonazePAM (KLONOPIN) tablet 2 mg  2 mg Oral TID Patriciaann Clan E, PA-C   2 mg at 04/15/17 1214  . estrogens (conjugated) (PREMARIN) tablet 1.25 mg  1.25 mg Oral Daily Laverle Hobby, PA-C   1.25 mg at 04/14/17 2155  . levothyroxine (SYNTHROID, LEVOTHROID) tablet 137 mcg  137 mcg Oral QAC breakfast Laverle Hobby, PA-C   137 mcg at 04/15/17 5009  . lisinopril (PRINIVIL,ZESTRIL) tablet 20 mg  20 mg Oral QPM Patriciaann Clan E, PA-C   20 mg at 04/14/17 2155  . magnesium hydroxide (MILK OF MAGNESIA) suspension 30 mL  30 mL Oral Daily PRN Patriciaann Clan E, PA-C      . propranolol ER (INDERAL LA) 24 hr capsule 60 mg  60 mg Oral Daily Laverle Hobby, PA-C   60 mg at 04/14/17 2155  . venlafaxine XR (EFFEXOR-XR) 24 hr capsule 150 mg  150 mg Oral QPM Laverle Hobby, PA-C   150 mg at 04/14/17 2203   PTA Medications: Medications Prior to Admission  Medication Sig Dispense Refill Last Dose  . clonazePAM (KLONOPIN) 2 MG tablet Take 2 mg by mouth 3 (three) times daily.    04/15/2017  . EPINEPHrine (EPIPEN) 0.3 mg/0.3 mL DEVI Inject 0.3 mg into the muscle once as needed (For anaphylaxis.).    unknown  . estrogens, conjugated, (PREMARIN) 1.25 MG tablet Take 1.25 mg by mouth at bedtime.   04/14/2017  . levothyroxine (SYNTHROID, LEVOTHROID) 137 MCG tablet Take 137 mcg by mouth daily before breakfast.    04/15/2017  . lisinopril (PRINIVIL,ZESTRIL) 20 MG tablet Take 20 mg by mouth at bedtime.   04/14/2017  . propranolol ER (INDERAL LA) 60 MG 24 hr capsule Take 60 mg by mouth at bedtime.   04/14/2017  . venlafaxine XR (EFFEXOR-XR) 150 MG 24 hr capsule Take 150 mg by mouth at bedtime.    04/14/2017  . ondansetron (ZOFRAN ODT) 4 MG disintegrating tablet 36m ODT q6 hours prn  nausea/vomit (Patient not taking: Reported on 04/15/2017) 10 tablet 0 Not Taking at Unknown time  . oxyCODONE-acetaminophen (PERCOCET) 5-325 MG tablet Take 1 tablet every 6 (six) hours as needed by mouth for severe pain. (Patient not taking: Reported on 04/15/2017) 20 tablet 0 Not Taking at Unknown time    Musculoskeletal: Strength & Muscle Tone: within normal limits Gait & Station: mobilizing in wheel chair as she states she has history of falling, instability when she walks  Patient leans: N/A  Psychiatric Specialty Exam: Physical Exam  Review of Systems  Constitutional: Negative.   HENT: Negative.   Eyes: Negative.   Respiratory: Negative.   Cardiovascular: Negative.   Gastrointestinal: Negative for blood in stool, heartburn, nausea and vomiting.  Genitourinary: Negative.   Musculoskeletal: Negative.   Skin: Negative.   Neurological: Positive for tremors. Negative  for seizures.       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   Psychiatric/Behavioral: Positive for depression and suicidal ideas.  All other systems reviewed and are negative.   Blood pressure (!) 140/92, pulse (!) 55, temperature 98.8 F (37.1 C), temperature source Oral, resp. rate 18, height _0  (1.626 m), weight 92.5 kg (204 lb).Body mass index is 35.02 kg/m.  General Appearance: Well Groomed  Eye Contact:  Fair  Speech:  Normal Rate  Volume:  Normal  Mood:  Depressed  Affect:  Constricted  Thought Process:  Linear and Descriptions of Associations: Intact  Orientation:  Full (Time, Place, and Person)  Thought Content:  denies hallucinations, no delusions, not internally preoccupied at this time  Suicidal Thoughts:  No denies current suicidal or self injurious ideations,denies homicidal or violent ideations, contracts for safety on unit   Homicidal Thoughts:  No  Memory:  recent and remote grossly intact   Judgement:  Fair  Insight:  Fair  Psychomotor Activity:  Decreased-  no tremors, no diaphoresis  Concentration:  Concentration: Good and Attention Span: Good  Recall:  Good  Fund of Knowledge:  Good  Language:  Good  Akathisia:  Negative  Handed:  Right  AIMS (if indicated):     Assets:  Communication Skills Desire for Improvement Resilience  ADL's:  Intact  Cognition:  WNL  Sleep:  Number of Hours: 6.75    Treatment Plan Summary: Daily contact with patient to assess and evaluate symptoms and progress in treatment, Medication management, Plan inpatient treatment  and medications as below  Observation Level/Precautions:  15 minute checks  Laboratory:  as needed - TSH  Psychotherapy:  Milieu, group therapy   Medications:  We discussed options- I have reviewed potential increased risk of sedation, hypersomnolence, increased risk of fall s ( all of which she endorses )to BZDs , which she has been taking at high doses for years . She expresses some apprehension but agrees to ongoing gradual taper. Decrease Klonopin to 2 mgrs BID initially  - hold if sedated  She states Effexor XR has been well tolerated, continue at 150 mgrs QDAY, consider titrating dose up if needed .  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    Jenne Campus, MD 12/27/201812:16 PM

## 2017-04-15 NOTE — Progress Notes (Signed)
Patient ID: Lynn Santos, female   DOB: August 13, 1950, 65 y.o.   MRN: [id number]  DAR: Pt. Denies SI/HI and A/V Hallucinations. She reports that her sleep was poor, her appetite is fair, her concentration is poor, and her energy level is low. She rates her depression level 5/10, her hopelessness level is 7/10, and her anxiety level is 5/10. Patient does not report any pain at this time however does report some tremors. She is unsteady on her feet at this time. Patient is utilizing her wheelchair at this time to aide in ambulation. Support and encouragement provided to the patient. Scheduled medications administered to patient per physician's orders. Patient is seen in the milieu intermittently. Patient's affect is flat however brightens when talking about memories of the beach. Q15 minute checks are maintained for safety.

## 2017-04-15 NOTE — Progress Notes (Addendum)
Patient ID: Lynn Santos, female   DOB: [birth year], 67 y.o.   MRN: [id number]  Pt currently presents with a blunted affect and precoccupied behavior. Pt vitals remain hypertensive tonight.  Pt states "my cardiologist told me to take an extra dose of lisinopril if it gets too hight." Pt reports good sleep without taking any current medications. Pt seen using wheelchair properly. Reports an increase in appetite. Presents with some mild confusion tonight.  Pt provided with medications per providers orders. Pt's labs and vitals were monitored throughout the night. Pt given a 1:1 about emotional and mental status. Pt supported and encouraged to express concerns and questions. Pt educated on medications.  Pt's safety ensured with 15 minute and environmental checks. Pt currently denies SI/HI and A/V hallucinations. Pt verbally agrees to seek staff if SI/HI or A/VH occurs and to consult with staff before acting on any harmful thoughts. Will continue POC.

## 2017-04-15 NOTE — BHH Suicide Risk Assessment (Signed)
[place] INPATIENT:  Family/Significant Other Suicide Prevention Education  Suicide Prevention Education:  Patient Refusal for Family/Significant Other Suicide Prevention Education: The patient Lynn Santos has refused to provide written consent for family/significant other to be provided Family/Significant Other Suicide Prevention Education during admission and/or prior to discharge.  Physician notified.  SPE completed with pt, as pt refused to consent to family contact. SPI pamphlet provided to pt and pt was encouraged to share information with support network, ask questions, and talk about any concerns relating to SPE. Pt denies access to guns/firearms and verbalized understanding of information provided. Mobile Crisis information also provided to pt.   Aedon Deason N Smart LCSW 04/15/2017, 11:13 AM

## 2017-04-15 NOTE — [facility]HH Suicide Risk Assessment (Signed)
[facility] Admission Suicide Risk Assessment   Nursing information obtained from:   patient and chart  Demographic factors:   66 year old female, separated, on disability Current Mental Status:   see below Loss Factors:   difficulty with ambulation, limited family support  Historical Factors:   reports history of depression, anxiety- reports long term BZD use  Risk Reduction Factors:   resilience   Total Time spent with patient: 45 minutes Principal Problem:  MDD, no psychotic features  Diagnosis:   Patient Active Problem List   Diagnosis Date Noted  . MDD (major depressive disorder), recurrent episode, severe ([place]) [F33.2] 04/14/2017     Continued Clinical Symptoms:  Alcohol Use Disorder Identification Test Final Score (AUDIT): 0 The "Alcohol Use Disorders Identification Test", Guidelines for Use in Primary Care, Second Edition.  World Pharmacologist [facility]). Score between 0-7:  no or low risk or alcohol related problems. Score between 8-15:  moderate risk of alcohol related problems. Score between 16-19:  high risk of alcohol related problems. Score 20 or above:  warrants further diagnostic evaluation for alcohol dependence and treatment.   CLINICAL FACTORS:  66 year old female, separated, lives alone, on disability, presented due to worsening depression, suicidal ideations. Reports long term management with high doses of Klonopin which she reports is  for history of postural orthostasis . Reports history of falls, hypersomnolence . I have encouraged her to consider tapering down and off BZDs.    Psychiatric Specialty Exam: Physical Exam  ROS  Blood pressure (!) 140/92, pulse (!) 55, temperature 98.8 F (37.1 C), temperature source Oral, resp. rate 18, height 5\' 4"  (1.626 m), weight 92.5 kg (204 lb).Body mass index is 35.02 kg/m.  See admit note MSE   COGNITIVE FEATURES THAT CONTRIBUTE TO RISK:  Closed-mindedness and Loss of executive function    SUICIDE RISK:   Moderate:   Frequent suicidal ideation with limited intensity, and duration, some specificity in terms of plans, no associated intent, good self-control, limited dysphoria/symptomatology, some risk factors present, and identifiable protective factors, including available and accessible social support.  PLAN OF CARE: Patient will be admitted to inpatient psychiatric unit for stabilization and safety. Will provide and encourage milieu participation. Provide medication management and maked adjustments as needed.  Will follow daily.    I certify that inpatient services furnished can reasonably be expected to improve the patient's condition.   Jenne Campus, MD 04/15/2017, 1:31 PM

## 2017-04-15 NOTE — Plan of Care (Signed)
  Progressing Self-Concept: Ability to disclose and discuss suicidal ideas will improve 04/15/2017 1447 - Progressing by Rockne Coons, RN
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
## 2017-04-16 DIAGNOSIS — M2550 Pain in unspecified joint: Secondary | ICD-10-CM

## 2017-04-16 DIAGNOSIS — I10 Essential (primary) hypertension: Secondary | ICD-10-CM

## 2017-04-16 DIAGNOSIS — M549 Dorsalgia, unspecified: Secondary | ICD-10-CM

## 2017-04-16 MED ORDER — ESTROGENS CONJUGATED 0.625 MG PO TABS
1.2500 mg | ORAL_TABLET | Freq: Every day | ORAL | Status: DC
  Administered 2017-04-16: 1.25 mg via ORAL
  Filled 2017-04-16 (×3): qty 2

## 2017-04-16 MED ORDER — PROPRANOLOL HCL ER 60 MG PO CP24: 60.0000 mg | ORAL_CAPSULE | Freq: Every day | ORAL | Status: DC

## 2017-04-16 MED ORDER — HYDROCHLOROTHIAZIDE 12.5 MG PO CAPS
12.5000 mg | ORAL_CAPSULE | Freq: Every day | ORAL | Status: DC
  Administered 2017-04-16 – 2017-04-17 (×2): 12.5 mg via ORAL
  Filled 2017-04-16 (×5): qty 1

## 2017-04-16 MED ORDER — ESTROGENS CONJUGATED 0.625 MG PO TABS: 1.2500 mg | ORAL_TABLET | Freq: Every day | ORAL | Status: DC

## 2017-04-16 MED ORDER — PROPRANOLOL HCL ER 60 MG PO CP24
60.0000 mg | ORAL_CAPSULE | Freq: Every day | ORAL | Status: DC
  Administered 2017-04-16: 60 mg via ORAL
  Filled 2017-04-16 (×3): qty 1

## 2017-04-16 NOTE — Progress Notes (Signed)
Recreation Therapy Notes  Date: 04/16/17 Time: 0930 Location: 300 [address] Dayroom  Group Topic: Stress Management  Goal Area(s) Addresses:  Patient will verbalize importance of using healthy stress management.  Patient will identify positive emotions associated with healthy stress management.   Intervention: Stress Management  Activity :  Body Scan Meditation.  LRT introduced the stress management technique of meditation.  LRT played a meditation from the Calm app that guided them through a body scan to allow them to become aware of any sensations, tensions or uneasy feelings they may be experiencing.  Education:  Stress Management, Discharge Planning.   Education Outcome: Acknowledges edcuation/In group clarification offered/Needs additional education  Clinical Observations/Feedback: Pt did not attend group.    Victorino Sparrow, LRT/CTRS         Ria Comment, Lareina Espino A 04/16/2017 11:05 AM

## 2017-04-16 NOTE — Progress Notes (Signed)
[facility] MD Progress Note  04/16/2017 1:20 PM Lynn Santos  MRN:  [id number]   Subjective:  Patient reports that she must leave today because she has places she needs to be. She denies any SI/HI/AVH and depression or anxiety. She feels that she is not getting any help here and feels she would have done better in the outpatient setting. She states that she does not take Norvasc and is not taking it while she is here for her BP. She does state that she has needed a diuretic for fluid and HTN in the past though.   Objective: Patient's chart and findings reviewed and discussed with treatment team. Patient is informed that she will not be discharged today and will be re-evaluated tomorrow. There is concern for her immediate spin from suicidal thoughts to no complaints at all. Will discontinue the Norvasc. Will start HCTZ 12.5 mg Daily. Will continue all other medications. She has been interacting appropriately and has been in the milieu frequently.  Principal Problem: MDD (major depressive disorder), recurrent episode, severe ([place]) Diagnosis:   Patient Active Problem List   Diagnosis Date Noted  . HTN (hypertension) [I10] 04/16/2017  . MDD (major depressive disorder), recurrent episode, severe ([place]) [F33.2] 04/14/2017   Total Time spent with patient: 25 minutes  Past Psychiatric History: See H&P  Past Medical History:  Past Medical History:  Diagnosis Date  . Arthritis   . Brain tumor ([place])   . Depression   . Hypertension   . Postural tremor   . Skin cancer   . Thyroid disease     Past Surgical History:  Procedure Laterality Date  . ABDOMINAL HYSTERECTOMY    . ADENOIDECTOMY    . ANKLE SURGERY    . BACK SURGERY    . BRAIN SURGERY    . CERVICAL SPINE SURGERY    . DEEP BRAIN STIMULATOR PLACEMENT    . LUMBAR SPINE SURGERY    . ROTATOR CUFF REPAIR    . THYROID SURGERY    . TONSILLECTOMY     Family History: History reviewed. No pertinent family history. Family Psychiatric  History:  See H&P Social History:  Social History   Substance and Sexual Activity  Alcohol Use No     Social History   Substance and Sexual Activity  Drug Use No    Social History   Socioeconomic History  . Marital status: Married    Spouse name: None  . Number of children: None  . Years of education: None  . Highest education level: None  Social Needs  . Financial resource strain: None  . Food insecurity - worry: None  . Food insecurity - inability: None  . Transportation needs - medical: None  . Transportation needs - non-medical: None  Occupational History  . None  Tobacco Use  . Smoking status: Never Smoker  . Smokeless tobacco: Never Used  Substance and Sexual Activity  . Alcohol use: No  . Drug use: No  . Sexual activity: None  Other Topics Concern  . None  Social History Narrative  . None   Additional Social History:                         Sleep: Good  Appetite:  Good  Current Medications: Current Facility-Administered Medications  Medication Dose Route Frequency Provider Last Rate Last Dose  . acetaminophen (TYLENOL) tablet 650 mg  650 mg Oral Q6H PRN Laverle Hobby, PA-C      .  alum & mag hydroxide-simeth (MAALOX/MYLANTA) 200-200-20 MG/5ML suspension 30 mL  30 mL Oral Q4H PRN Laverle Hobby, PA-C      . clonazePAM Bobbye [place]) tablet 2 mg  2 mg Oral BID Kenya Shiraishi, Myer Peer, MD   2 mg at 04/16/17 0909  . estrogens (conjugated) (PREMARIN) tablet 1.25 mg  1.25 mg Oral Daily Money, Darnelle Maffucci B, FNP      . hydrochlorothiazide (MICROZIDE) capsule 12.5 mg  12.5 mg Oral Daily Money, Travis B, FNP   12.5 mg at 04/16/17 1054  . levothyroxine (SYNTHROID, LEVOTHROID) tablet 137 mcg  137 mcg Oral QAC breakfast Laverle Hobby, PA-C   137 mcg at 04/16/17 0602  . lisinopril (PRINIVIL,ZESTRIL) tablet 20 mg  20 mg Oral QPM Patriciaann Clan E, PA-C   20 mg at 04/15/17 1733  . magnesium hydroxide (MILK OF MAGNESIA) suspension 30 mL  30 mL Oral Daily PRN Patriciaann Clan E,  PA-C      . propranolol ER (INDERAL LA) 24 hr capsule 60 mg  60 mg Oral Daily Money, Lowry Ram, [place]      . venlafaxine XR (EFFEXOR-XR) 24 hr capsule 150 mg  150 mg Oral QPM Laverle Hobby, PA-C   150 mg at 04/15/17 1733    Lab Results:  Results for orders placed or performed during the hospital encounter of 04/14/17 (from the past 48 hour(s))  Rapid urine drug screen (hospital performed)     Status: Abnormal   Collection Time: 04/14/17  4:20 PM  Result Value Ref Range   Opiates NONE DETECTED NONE DETECTED   Cocaine NONE DETECTED NONE DETECTED   Benzodiazepines POSITIVE (A) NONE DETECTED   Amphetamines NONE DETECTED NONE DETECTED   Tetrahydrocannabinol NONE DETECTED NONE DETECTED   Barbiturates NONE DETECTED NONE DETECTED    Comment: (NOTE) DRUG SCREEN FOR MEDICAL PURPOSES ONLY.  IF CONFIRMATION IS NEEDED FOR ANY PURPOSE, NOTIFY LAB WITHIN 5 DAYS. LOWEST DETECTABLE LIMITS FOR URINE DRUG SCREEN Drug Class                     Cutoff (ng/mL) Amphetamine and metabolites    1000 Barbiturate and metabolites    200 Benzodiazepine                 952 Tricyclics and metabolites     300 Opiates and metabolites        300 Cocaine and metabolites        300 THC                            50   Comprehensive metabolic panel     Status: Abnormal   Collection Time: 04/14/17  4:35 PM  Result Value Ref Range   Sodium 138 135 - 145 mmol/L   Potassium 3.7 3.5 - 5.1 mmol/L   Chloride 101 101 - 111 mmol/L   CO2 29 22 - 32 mmol/L   Glucose, Bld 92 65 - 99 mg/dL   BUN 14 6 - 20 mg/dL   Creatinine, Ser 0.97 0.44 - 1.00 mg/dL   Calcium 8.2 (L) 8.9 - 10.3 mg/dL   Total Protein 8.2 (H) 6.5 - 8.1 g/dL   Albumin 3.4 (L) 3.5 - 5.0 g/dL   AST 15 15 - 41 U/L   ALT 9 (L) 14 - 54 U/L   Alkaline Phosphatase 86 38 - 126 U/L   Total Bilirubin 0.4 0.3 - 1.2 mg/dL   GFR calc  non Af Amer 59 (L) >60 mL/min   GFR calc Af Amer >60 >60 mL/min    Comment: (NOTE) The eGFR has been calculated using the CKD EPI  equation. This calculation has not been validated in all clinical situations. eGFR's persistently <60 mL/min signify possible Chronic Kidney Disease.    Anion gap 8 5 - 15  Ethanol     Status: None   Collection Time: 04/14/17  4:35 PM  Result Value Ref Range   Alcohol, Ethyl (B) <03 <01 mg/dL  Salicylate level     Status: None   Collection Time: 04/14/17  4:35 PM  Result Value Ref Range   Salicylate Lvl <3.1 2.8 - 30.0 mg/dL  Acetaminophen level     Status: Abnormal   Collection Time: 04/14/17  4:35 PM  Result Value Ref Range   Acetaminophen (Tylenol), Serum <10 (L) 10 - 30 ug/mL    Comment:        THERAPEUTIC CONCENTRATIONS VARY SIGNIFICANTLY. A RANGE OF 10-30 ug/mL MAY BE AN EFFECTIVE CONCENTRATION FOR MANY PATIENTS. HOWEVER, SOME ARE BEST TREATED AT CONCENTRATIONS OUTSIDE THIS RANGE. ACETAMINOPHEN CONCENTRATIONS >150 ug/mL AT 4 HOURS AFTER INGESTION AND >50 ug/mL AT 12 HOURS AFTER INGESTION ARE OFTEN ASSOCIATED WITH TOXIC REACTIONS.   cbc     Status: None   Collection Time: 04/14/17  4:35 PM  Result Value Ref Range   WBC 6.9 4.0 - 10.5 K/uL   RBC 4.03 3.87 - 5.11 MIL/uL   Hemoglobin 12.0 12.0 - 15.0 g/dL   HCT 38.1 36.0 - 46.0 %   MCV 94.5 78.0 - 100.0 fL   MCH 29.8 26.0 - 34.0 pg   MCHC 31.5 30.0 - 36.0 g/dL   RDW 14.1 11.5 - 15.5 %   Platelets 275 150 - 400 K/uL    Blood Alcohol level:  Lab Results  Component Value Date   ETH <10 43/88/8757    Metabolic Disorder Labs: No results found for: HGBA1C, MPG No results found for: PROLACTIN No results found for: CHOL, TRIG, HDL, CHOLHDL, VLDL, LDLCALC  Physical Findings: AIMS: Facial and Oral Movements Muscles of Facial Expression: None, normal Lips and Perioral Area: None, normal Jaw: None, normal Tongue: None, normal,Extremity Movements Upper (arms, wrists, hands, fingers): None, normal Lower (legs, knees, ankles, toes): None, normal, Trunk Movements Neck, shoulders, hips: None, normal, Overall  Severity Severity of abnormal movements (highest score from questions above): None, normal Incapacitation due to abnormal movements: None, normal Patient's awareness of abnormal movements (rate only patient's report): No Awareness, Dental Status Current problems with teeth and/or dentures?: No Does patient usually wear dentures?: No  CIWA:    COWS:     Musculoskeletal: Strength & Muscle Tone: within normal limits Gait & Station: normal Patient leans: N/A  Psychiatric Specialty Exam: Physical Exam  Nursing note and vitals reviewed. Constitutional: She is oriented to person, place, and time. She appears well-developed and well-nourished.  Respiratory: Effort normal.  Musculoskeletal:  Extremity weakness and uses a wheelchair  Neurological: She is alert and oriented to person, place, and time.  Skin: Skin is warm.    Review of Systems  Constitutional: Negative.   HENT: Negative.   Eyes: Negative.   Respiratory: Negative.   Cardiovascular: Negative.   Gastrointestinal: Negative.   Genitourinary: Negative.   Musculoskeletal: Positive for back pain and joint pain.  Skin: Negative.   Neurological: Negative.   Endo/Heme/Allergies: Negative.   Psychiatric/Behavioral: Negative.     Blood pressure (!) 148/71, pulse (!) 59, temperature 98  F (36.7 C), temperature source Oral, resp. rate 18, height '5\' 4"'  (1.626 m), weight 92.5 kg (204 lb).Body mass index is 35.02 kg/m.  General Appearance: Casual  Eye Contact:  Good  Speech:  Clear and Coherent and Normal Rate  Volume:  Normal  Mood:  She presents euthymic, but then appears irritable after being told she could not leave today.  Affect:  Flat  Thought Process:  Goal Directed and Descriptions of Associations: Intact  Orientation:  Full (Time, Place, and Person)  Thought Content:  WDL  Suicidal Thoughts:  No  Homicidal Thoughts:  No  Memory:  Immediate;   Good Recent;   Good Remote;   Good  Judgement:  Fair  Insight:  Fair   Psychomotor Activity:  Normal  Concentration:  Concentration: Good and Attention Span: Good  Recall:  Good  Fund of Knowledge:  Good  Language:  Good  Akathisia:  No  Handed:  Right  AIMS (if indicated):     Assets:  Communication Skills Desire for Improvement Financial Resources/Insurance Housing Social Support Transportation  ADL's:  Intact  Cognition:  WNL  Sleep:  Number of Hours: 6.75   Problems Addressed: HTN MDD severe  Treatment Plan Summary: Daily contact with patient to assess and evaluate symptoms and progress in treatment, Medication management and Plan is to:  -Continue Klonopin 2 mg PO BID for anxiety -Continue Lisinopril 20 mg PO Daily for mood stability -Continue Effexor-XR 150 mg PO Daily for mood stability -Start HCTZ 12.5 mg PO Daily for HTN -Encourage group therapy participation  Lewis Shock, FNP 04/16/2017, 1:20 PM  Agree with NP Progress Note

## 2017-04-16 NOTE — Tx Team (Signed)
Interdisciplinary Treatment and Diagnostic Plan Update  04/16/2017 Time of Session: [place] MRN: [id number]  Principal Diagnosis: MDD recurrent, severe  Secondary Diagnoses: Active Problems:   MDD (major depressive disorder), recurrent episode, severe (HCC)   Current Medications:  Current Facility-Administered Medications  Medication Dose Route Frequency Provider Last Rate Last Dose  . acetaminophen (TYLENOL) tablet 650 mg  650 mg Oral Q6H PRN Laverle Hobby, PA-C      . alum & mag hydroxide-simeth (MAALOX/MYLANTA) 200-200-20 MG/5ML suspension 30 mL  30 mL Oral Q4H PRN Laverle Hobby, PA-C      . clonazePAM Bobbye [place]) tablet 2 mg  2 mg Oral BID Cobos, Myer Peer, MD   2 mg at 04/16/17 0909  . [START ON 04/17/2017] estrogens (conjugated) (PREMARIN) tablet 1.25 mg  1.25 mg Oral Daily Money, Darnelle Maffucci B, FNP      . hydrochlorothiazide (MICROZIDE) capsule 12.5 mg  12.5 mg Oral Daily Money, Travis B, FNP      . levothyroxine (SYNTHROID, LEVOTHROID) tablet 137 mcg  137 mcg Oral QAC breakfast Laverle Hobby, PA-C   137 mcg at 04/16/17 0602  . lisinopril (PRINIVIL,ZESTRIL) tablet 20 mg  20 mg Oral QPM Patriciaann Clan E, PA-C   20 mg at 04/15/17 1733  . magnesium hydroxide (MILK OF MAGNESIA) suspension 30 mL  30 mL Oral Daily PRN Laverle Hobby, PA-C      . [START ON 04/17/2017] propranolol ER (INDERAL LA) 24 hr capsule 60 mg  60 mg Oral Daily Money, Lowry Ram, FNP      . venlafaxine XR (EFFEXOR-XR) 24 hr capsule 150 mg  150 mg Oral QPM Patriciaann Clan E, PA-C   150 mg at 04/15/17 1733   PTA Medications: Medications Prior to Admission  Medication Sig Dispense Refill Last Dose  . clonazePAM (KLONOPIN) 2 MG tablet Take 2 mg by mouth 3 (three) times daily.    04/15/2017  . EPINEPHrine (EPIPEN) 0.3 mg/0.3 mL DEVI Inject 0.3 mg into the muscle once as needed (For anaphylaxis.).    unknown  . estrogens, conjugated, (PREMARIN) 1.25 MG tablet Take 1.25 mg by mouth at bedtime.    04/14/2017  . levothyroxine (SYNTHROID, LEVOTHROID) 137 MCG tablet Take 137 mcg by mouth daily before breakfast.    04/15/2017  . lisinopril (PRINIVIL,ZESTRIL) 20 MG tablet Take 20 mg by mouth at bedtime.   04/14/2017  . propranolol ER (INDERAL LA) 60 MG 24 hr capsule Take 60 mg by mouth at bedtime.   04/14/2017  . venlafaxine XR (EFFEXOR-XR) 150 MG 24 hr capsule Take 150 mg by mouth at bedtime.    04/14/2017  . ondansetron (ZOFRAN ODT) 4 MG disintegrating tablet 4mg  ODT q6 hours prn nausea/vomit (Patient not taking: Reported on 04/15/2017) 10 tablet 0 Not Taking at Unknown time  . oxyCODONE-acetaminophen (PERCOCET) 5-325 MG tablet Take 1 tablet every 6 (six) hours as needed by mouth for severe pain. (Patient not taking: Reported on 04/15/2017) 20 tablet 0 Not Taking at Unknown time    Patient Stressors: Health problems Marital or family conflict  Patient Strengths: Ability for insight Average or above average intelligence Capable of independent living [employer] of knowledge Motivation for treatment/growth  Treatment Modalities: Medication Management, Group therapy, Case management,  1 to 1 session with clinician, Psychoeducation, Recreational therapy.   Physician Treatment Plan for Primary Diagnosis: MDD recurrent, severe Long Term Goal(s): Improvement in symptoms so as ready for discharge Improvement in symptoms so as ready for discharge   Short Term  Goals: Ability to identify changes in lifestyle to reduce recurrence of condition will improve Ability to maintain clinical measurements within normal limits will improve Ability to identify triggers associated with substance abuse/mental health issues will improve  Medication Management: Evaluate patient's response, side effects, and tolerance of medication regimen.  Therapeutic Interventions: 1 to 1 sessions, Unit Group sessions and Medication administration.  Evaluation of Outcomes: Progressing  Physician Treatment Plan for  Secondary Diagnosis: Active Problems:   MDD (major depressive disorder), recurrent episode, severe ([place])  Long Term Goal(s): Improvement in symptoms so as ready for discharge Improvement in symptoms so as ready for discharge   Short Term Goals: Ability to identify changes in lifestyle to reduce recurrence of condition will improve Ability to maintain clinical measurements within normal limits will improve Ability to identify triggers associated with substance abuse/mental health issues will improve     Medication Management: Evaluate patient's response, side effects, and tolerance of medication regimen.  Therapeutic Interventions: 1 to 1 sessions, Unit Group sessions and Medication administration.  Evaluation of Outcomes: Progressing   RN Treatment Plan for Primary Diagnosis: MDD recurrent, severe Long Term Goal(s): Knowledge of disease and therapeutic regimen to maintain health will improve  Short Term Goals: Ability to remain free from injury will improve, Ability to verbalize feelings will improve and Ability to disclose and discuss suicidal ideas  Medication Management: RN will administer medications as ordered by provider, will assess and evaluate patient's response and provide education to patient for prescribed medication. RN will report any adverse and/or side effects to prescribing provider.  Therapeutic Interventions: 1 on 1 counseling sessions, Psychoeducation, Medication administration, Evaluate responses to treatment, Monitor vital signs and CBGs as ordered, Perform/monitor CIWA, COWS, AIMS and Fall Risk screenings as ordered, Perform wound care treatments as ordered.  Evaluation of Outcomes: Progressing   LCSW Treatment Plan for Primary Diagnosis: MDD recurrent, severe Long Term Goal(s): Safe transition to appropriate next level of care at discharge, Engage patient in therapeutic group addressing interpersonal concerns.  Short Term Goals: Engage patient in aftercare  planning with referrals and resources, Facilitate patient progression through stages of change regarding substance use diagnoses and concerns and Increase skills for wellness and recovery  Therapeutic Interventions: Assess for all discharge needs, 1 to 1 time with Social worker, Explore available resources and support systems, Assess for adequacy in community support network, Educate family and significant other(s) on suicide prevention, Complete Psychosocial Assessment, Interpersonal group therapy.  Evaluation of Outcomes: Progressing   Progress in Treatment: Attending groups: Yes. Participating in groups: Yes. Taking medication as prescribed: Yes. Toleration medication: Yes. Family/Significant other contact made: SPE completed with pt; pt declined to consent to family contact.  Patient understands diagnosis: Yes. Discussing patient identified problems/goals with staff: Yes. Medical problems stabilized or resolved: Yes. Denies suicidal/homicidal ideation: Yes. Issues/concerns per patient self-inventory: No. Other: n/a  New problem(s) identified: No, Describe:  n/a  New Short Term/Long Term Goal(s): medication management for mood stabilization; elimination of SI thoughts; development of comprehensive mental wellness plan.   Discharge Plan or Barriers: CSW assessing for appropriate referrals.   Reason for Continuation of Hospitalization: Anxiety Depression Medication stabilization Suicidal ideation  Estimated Length of Stay: Sunday, 04/18/17  Attendees: Patient: 04/16/2017 10:36 AM  Physician: Dr. Parke Poisson MD 04/16/2017 10:36 AM  Nursing: Rayburn Felt RN 04/16/2017 10:36 AM  RN Care Manager:x 04/16/2017 10:36 AM  Social Worker: Maxie Better, LCSW 04/16/2017 10:36 AM  Recreational Therapist: x 04/16/2017 10:36 AM  Other: Lindell Spar NP; [employer]  NP 04/16/2017 10:36 AM  Other:  04/16/2017 10:36 AM  Other: 04/16/2017 10:36 AM    Scribe for Treatment Team: [place], LCSW 04/16/2017 10:36 AM

## 2017-04-16 NOTE — Progress Notes (Signed)
Pt attend wrap up group. Her day was a 10. She slept all day.

## 2017-04-16 NOTE — Plan of Care (Signed)
  Progressing Safety: Ability to disclose and discuss suicidal ideas will improve 04/16/2017 1638 - Progressing by Rockne Coons, RN

## 2017-04-16 NOTE — Progress Notes (Signed)
Patient ID: Lynn Santos, female   DOB: [birth year], 66 y.o.   MRN: [id number]  DAR: Pt. Denies SI/HI and A/V Hallucinations. She reports that her sleep last night was good, her appetite is good, her energy level is low, and her concentration is good. She rates her depression level is 5/10, her hopelessness level 5/10, and anxiety level is 7/10. Patient does not report any pain or discomfort at this time. She continues to utilize her wheelchair to aide in ambulation on the unit. Patient's BP remains elevated, NP Money is aware of patient's elevated BP. Support and encouragement provided to the patient. Scheduled medications administered to patient per physician's Patient is seen in the milieu throughout the day mostly keeping to herself. She is constricted in affect and depressed in mood. She is focused on discharging soon. Q15 minute checks are maintained for safety.

## 2017-04-16 NOTE — Progress Notes (Signed)
Pt in dayroom in wheelchair.  Pt participated in group.  Pt denies SI, HI and AVH. Pt offered emotional support and encouragement. Pt maintained on q 15 min checks and remains safe on unit.

## 2017-04-16 NOTE — Progress Notes (Signed)
Pt attend wrap up group. Her day was a 5. Pt said she has enjoyed being here. She enjoy talking and meeting new people. She do not like living by herself alone. Pt said by her being here help her to realize she can get alone with other people.

## 2017-04-16 NOTE — Evaluation (Signed)
Physical Therapy Evaluation Patient Details Name: Lynn Santos MRN: [id number] DOB: 1950-08-24 Today's Date: 04/16/2017   History of Present Illness  66 year old female, lives alone, presented to the ED on 12/26 voluntarily with  history of depression ,and reports history of intermittent suicidal ideations. S/Pmultilevel spine surgeries, brain stimulator implant for tremors.  Clinical Impression  The patient is noted to have  Impaired balance unless using UE support. The patient relates that this is her baseline, Brain stimulator is not effective for the tremors and ataxia. The patient reports that she has all DME needed. She has been through therapies is past and is not interested in follow up. She states that she wants to get to [facility] and take care of her dogs.  No further PT indicated. PT to sign off.    Follow Up Recommendations No PT follow up    Equipment Recommendations  None recommended by PT    Recommendations for Other Services       Precautions / Restrictions Precautions Precautions: Fall Precaution Comments: ataxic gait at baseline, HAS A BRAIN STIMULATOR THAT PATIENT REPORTS IS NOt BENEFICIAL.      Mobility  Bed Mobility Overal bed mobility: Independent                Transfers Overall transfer level: Modified independent               General transfer comment: relies on hands for stability  Ambulation/Gait Ambulation/Gait assistance: Modified independent (Device/Increase time) Ambulation Distance (Feet): 20 Feet Assistive device: Pushed wheelchair Gait Pattern/deviations: Ataxic;Wide base of support     General Gait Details: ambulated x 10' without UE support with ballistic forward thrust of gait to get to the  next surface. With WC, gait is ataxic but is much more stable.   Stairs            Wheelchair Mobility    Modified Rankin (Stroke Patients Only)       Balance Overall balance assessment: History of Falls;Needs  assistance Sitting-balance support: No upper extremity supported;Feet supported Sitting balance-Leahy Scale: Good     Standing balance support: During functional activity;Bilateral upper extremity supported Standing balance-Leahy Scale: Fair Standing balance comment: poor without UE support                             Pertinent Vitals/Pain Pain Assessment: Faces Faces Pain Scale: Hurts little more Pain Location: right shoulder Pain Descriptors / Indicators: Discomfort;Grimacing Pain Intervention(s): Monitored during session    Home Living Family/patient expects to be discharged to:: Private residence Living Arrangements: Alone Available Help at Discharge: Friend(s) Type of Home: House Home Access: Level entry     Home Layout: One level Home Equipment: Environmental consultant - 4 wheels;Shower seat;Hand held shower head;Adaptive equipment;Transport chair;Electric scooter Additional Comments: PATIEN NEEDS A REACHER    Prior Function Level of Independence: Independent with assistive device(s)   Gait / Transfers Assistance Needed: cruises fast in house if not using chair  ADL's / Homemaking Assistance Needed: independent with bath  Comments: PATIENT REPORTS ABLE TO GET POWER SCOOTER OFF OF LIFT AT BACK OF HER CAR INDEPENDENTLY     Hand Dominance        Extremity/Trunk Assessment   Upper Extremity Assessment Upper Extremity Assessment: RUE deficits/detail RUE Deficits / Details: decreased elevation of shoulder from fracture    Lower Extremity Assessment Lower Extremity Assessment: RLE deficits/detail;LLE deficits/detail RLE Deficits / Details: grossly Heel shin, RAM  and RRM are individually [facility]. Significant tremors and ataxia when standing and walking LLE Deficits / Details: sameas right       Communication   Communication: No difficulties  Cognition Arousal/Alertness: Awake/alert Behavior During Therapy: WFL for tasks assessed/performed Overall Cognitive Status:  Within Functional Limits for tasks assessed                                        General Comments      Exercises     Assessment/Plan    PT Assessment Patent does not need any further PT services  PT Problem List         PT Treatment Interventions      PT Goals (Current goals can be found in the Care Plan section)  Acute Rehab PT Goals Patient Stated Goal: to get out of here and take care of my dogs. PT Goal Formulation: All assessment and education complete, DC therapy    Frequency     Barriers to discharge        Co-evaluation               AM-PAC PT "6 Clicks" Daily Activity  Outcome Measure Difficulty turning over in bed (including adjusting bedclothes, sheets and blankets)?: None Difficulty moving from lying on back to sitting on the side of the bed? : None Difficulty sitting down on and standing up from a chair with arms (e.g., wheelchair, bedside commode, etc,.)?: A Little Help needed moving to and from a bed to chair (including a wheelchair)?: A Little Help needed walking in hospital room?: A Lot Help needed climbing 3-5 steps with a railing? : Total 6 Click Score: 17    End of Session   Activity Tolerance: Patient tolerated treatment well Patient left: in chair Nurse Communication: Mobility status PT Visit Diagnosis: Unsteadiness on feet (R26.81);Other symptoms and signs involving the nervous system (R29.898);Repeated falls (R29.6)    Time: 0930-1000 PT Time Calculation (min) (ACUTE ONLY): 30 min   Charges:   PT Evaluation $PT Eval Low Complexity: 1 Low PT Treatments $Gait Training: 8-22 mins   PT G Codes:   PT G-Codes **NOT FOR INPATIENT CLASS** Functional Assessment Tool Used: Clinical judgement;AM-PAC 6 Clicks Basic Mobility Functional Limitation: Mobility: Walking and moving around Mobility: Walking and Moving Around Current Status (E4540): At least 20 percent but less than 40 percent impaired, limited or  restricted Mobility: Walking and Moving Around Goal Status [phone]): At least 20 percent but less than 40 percent impaired, limited or restricted Mobility: Walking and Moving Around Discharge Status [phone]): At least 20 percent but less than 40 percent impaired, limited or restricted    [facility] PT [phone]   Claretha Cooper 04/16/2017, 10:27 AM

## 2017-04-17 MED ORDER — VENLAFAXINE HCL ER 150 MG PO CP24: 150.0000 mg | ORAL_CAPSULE | Freq: Every evening | ORAL | 0 refills | Status: AC

## 2017-04-17 MED ORDER — CLONAZEPAM 1 MG PO TABS: 1.0000 mg | ORAL_TABLET | Freq: Three times a day (TID) | ORAL | Status: DC

## 2017-04-17 MED ORDER — HYDROCHLOROTHIAZIDE 12.5 MG PO CAPS: 12.5000 mg | ORAL_CAPSULE | Freq: Every day | ORAL | 0 refills | Status: AC

## 2017-04-17 NOTE — BHH Suicide Risk Assessment (Addendum)
[facility] Discharge Suicide Risk Assessment   Principal Problem: MDD (major depressive disorder), recurrent episode, severe ([place]) Discharge Diagnoses:  Patient Active Problem List   Diagnosis Date Noted  . HTN (hypertension) [I10] 04/16/2017  . MDD (major depressive disorder), recurrent episode, severe ([place]) [F33.2] 04/14/2017    Total Time spent with patient: 30 minutes  Musculoskeletal: Strength & Muscle Tone: within normal limits Gait & Station: normal- mobilizing in wheel chair on unit  due to history of unsteady gait. States at home walks with walker . Patient leans: N/A  Psychiatric Specialty Exam: ROS  Denies headache, no chest pain, no shortness of breath, no vomiting, no abdominal pain.   Blood pressure (!) 167/88, pulse (!) 52, temperature 97.6 F (36.4 C), temperature source Oral, resp. rate 18, height 5\' 4"  (1.626 m), weight 92.5 kg (204 lb).Body mass index is 35.02 kg/m.  General Appearance: improved grooming   Eye Contact::  Good  Speech:  Normal Rate409  Volume:  Normal  Mood:  denies feeling depressed, and states " my mood is OK today", presents with improved mood   Affect:  Appropriate and Full Range  Thought Process:  Linear and Descriptions of Associations: Intact  Orientation:  Full (Time, Place, and Person)  Thought Content:  no hallucinations, no delusions,not internally preoccupied   Suicidal Thoughts:  No denies suicidal plan or intention, is future oriented, states she needs to take of her dogs, which are currently with family members  Homicidal Thoughts:  No  Memory:  recent and remote grossly intact   Judgement:  Other:  improving   Insight:  improving   Psychomotor Activity:  Normal  Concentration:  Good  Recall:  Good  Fund of Knowledge:Good  Language: Good  Akathisia:  Negative  Handed:  Right  AIMS (if indicated):     Assets:  Communication Skills Desire for Improvement Resilience  Sleep:  Number of Hours: 6.25  Cognition: WNL  ADL's:  Intact    Mental Status Per Nursing Assessment::   On Admission:     Demographic Factors:  Married, currently separated, lives alone,on disability   Loss Factors: Mobility difficulties , postural tremor, limited support network, disability  Historical Factors: No prior psychiatric admissions, history of a prior suicide attempt by overdosing 15 years ago, history of depression  Risk Reduction Factors:   Positive coping skills or problem solving skills and states she has a strong sense of responsibility to her pet dogs   Continued Clinical Symptoms:  At this time patient is alert , attentive, well related, presents calm, describes mood as improved and affect is more reactive, smiles at times appropriately, no thought disorder, no suicidal or self injurious ideations, no homicidal or violent ideations, no psychotic symptoms, future oriented, states she is looking forward to reuniting with her pet dogs, and states " I need to pay bills , get ready for the new year,  take down Christmas decorations ".  Denies medication side effects Behavior on unit calm, in good control, presents pleasant on approach We have reviewed risks associated with BZDs, to include sedation, addiction, withdrawal, and potential increase risk in falls and cognitive difficulties. Patient does state she feels more steady as Klonopin dose has been decreased and states she plans to discuss further gradual taper with her PCP. No withdrawal symptoms at this time.  Advised not to drive if sedated or taking sedating medications.   Cognitive Features That Contribute To Risk:  No gross cognitive deficits noted upon discharge. Is alert , attentive,  and oriented x 3   Suicide Risk:  Mild:  Suicidal ideation of limited frequency, intensity, duration, and specificity.  There are no identifiable plans, no associated intent, mild dysphoria and related symptoms, good self-control (both objective and subjective assessment), few other risk  factors, and identifiable protective factors, including available and accessible social support.  Follow-up Information    [facility] Follow up.   Specialty:  Behavioral Health Why:  Medication management/psyciatry appt with Dr. Daron Offer on Tuesday, 05/18/17 at 2:00PM. Please bring insurance card to this appt. Thank you.  Contact information: [place] [phone]          Plan Of Care/Follow-up recommendations:  Activity:  as tolerated  Diet:  Heart Healthy Tests:  NA Other:  See below  Patient is requesting discharge and there are no current grounds for involuntary commitment . Plans to return home . Plans to follow up as above . She has an established PCP, Dr. Caprice Beaver, for ongoing medical management .  Jenne Campus, MD 04/17/2017, 8:23 AM

## 2017-04-17 NOTE — Progress Notes (Signed)
Pt discharged to lobby via w/c- assisted pt from w/c to friend's car. Pt was stable and appreciative at that time. All papers and prescriptions were given and valuables returned. Discussed follow up care and prescriptions with patient. Verbal understanding expressed. Denies SI/HI and A/VH. Pt given opportunity to express concerns and ask questions.

## 2017-04-17 NOTE — Progress Notes (Signed)
  [facility] Adult Case Management Discharge Plan :  Will you be returning to the same living situation after discharge:  Yes,  home alone At discharge, do you have transportation home?: Yes,  arranged by patient Do you have the ability to pay for your medications: Yes,  patient states there are no barriers  Release of information consent forms completed and turned in to Medical Records by CSW.  Patient to Follow up at: Follow-up Information    [facility] Follow up.   Specialty:  Behavioral Health Why:  Medication management/psyciatry appt with Dr. Daron Offer on Tuesday, 05/18/17 at 2:00PM. Please bring insurance card to this appt. Thank you.  Contact information: [place] [phone]          Next level of care provider has access to [place] and Suicide Prevention discussed: Yes,  with patient only, declined with other  Have you used any form of tobacco in the last 30 days? (Cigarettes, Smokeless Tobacco, Cigars, and/or Pipes): No  Has patient been referred to the Quitline?: N/A patient is not a smoker  Patient has been referred for addiction treatment: N/A  Maretta Los, LCSW 04/17/2017, 9:32 AM

## 2017-04-17 NOTE — BHH Group Notes (Signed)
[facility] LCSW Group Therapy Note  Date/Time:    04/17/2017 10:00-11:00AM  Type of Therapy and Topic:  Group Therapy:  Healthy vs Unhealthy Coping Skills  Participation Level:  Active   Description of Group:  The focus of this group was to determine what unhealthy coping techniques typically are used by group members and what healthy coping techniques would be helpful in coping with various problems. Patients were guided in becoming aware of the differences between healthy and unhealthy coping techniques.  Patients were asked to identify healthy coping skills they plan to use when they leave the hospital to fill "the hole" that is left by discontinuing the use of unhealthy coping skills such as self-harm, drinking, and stopping medication.  Therapeutic Goals 1. Patients described the reasons for their hospitalization, which included unhealthy coping they used that led to the hospital stay 2. Patients defined and discussed healthy vs unhealthy coping techniques 3. Patients identified their preferred coping techniques and identified whether these were healthy or unhealthy 4. Patients determined 1-2 healthy coping skills they would like to become more familiar with and use more often 5. Patients provided support and ideas to each other  Summary of Patient Progress: During group, patient expressed that she is hospitalized for a medication evaluation and any necessary changes, and agreed with other patients about how her symptoms of depression are often displayed.  She talked about laying in bed for days, not taking care of her home or herself.  She asked a lot of questions of other people in the room, and was very engaged throughout the entire discussion.   Therapeutic Modalities Cognitive Behavioral Therapy Motivational Interviewing   Selmer Dominion, LCSW 04/17/2017, 11:08 AM

## 2017-04-17 NOTE — Discharge Summary (Signed)
Physician Discharge Summary Note  Patient:  Lynn Santos is an 66 y.o., female MRN:  [id number] DOB:  [birth year] Patient phone:  [phone] (home)  Patient address:   [address] [place] [address],  Total Time spent with patient: 20 minutes  Date of Admission:  04/14/2017 Date of Discharge:  04/17/2017  Reason for Admission: Per HPI- 66 year old female, lives alone, presented to the ED on 12/26 voluntarily. She has a history of depression ,and reports history of intermittent suicidal ideations. She feels that her depression has been worsening . She reports increase in suicidal ideations, and on admission reported thoughts of shooting self. She attributes depression in part to poor support network, lives alone, and states adult daughter unsupportive and adult son, husband live far away and are not particularly supportive either. Also reports she has been diagnosed with chronic tremors, orthostasis, resulting in frequent falls.    Principal Problem: MDD (major depressive disorder), recurrent episode, severe [facility]) Discharge Diagnoses: Patient Active Problem List   Diagnosis Date Noted  . HTN (hypertension) [I10] 04/16/2017  . MDD (major depressive disorder), recurrent episode, severe ([place]) [F33.2] 04/14/2017    Past Psychiatric History:   Past Medical History:  Past Medical History:  Diagnosis Date  . Arthritis   . Brain tumor ([place])   . Depression   . Hypertension   . Postural tremor   . Skin cancer   . Thyroid disease     Past Surgical History:  Procedure Laterality Date  . ABDOMINAL HYSTERECTOMY    . ADENOIDECTOMY    . ANKLE SURGERY    . BACK SURGERY    . BRAIN SURGERY    . CERVICAL SPINE SURGERY    . DEEP BRAIN STIMULATOR PLACEMENT    . LUMBAR SPINE SURGERY    . ROTATOR CUFF REPAIR    . THYROID SURGERY    . TONSILLECTOMY     Family History: History reviewed. No pertinent family history. Family Psychiatric  History: Social History:  Social  History   Substance and Sexual Activity  Alcohol Use No     Social History   Substance and Sexual Activity  Drug Use No    Social History   Socioeconomic History  . Marital status: Married    Spouse name: None  . Number of children: None  . Years of education: None  . Highest education level: None  Social Needs  . Financial resource strain: None  . Food insecurity - worry: None  . Food insecurity - inability: None  . Transportation needs - medical: None  . Transportation needs - non-medical: None  Occupational History  . None  Tobacco Use  . Smoking status: Never Smoker  . Smokeless tobacco: Never Used  Substance and Sexual Activity  . Alcohol use: No  . Drug use: No  . Sexual activity: None  Other Topics Concern  . None  Social History Narrative  . None    Hospital Course:  Lynn Santos was admitted for MDD (major depressive disorder), recurrent episode, severe ([place]) and crisis management.  Pt was treated discharged with the medications listed below under Medication List.  Medical problems were identified and treated as needed.  Home medications were restarted as appropriate.  Improvement was monitored by observation and Lynn Santos 's daily report of symptom reduction.  Emotional and mental status was monitored by daily self-inventory reports completed by Lynn Santos and clinical staff.         Lynn Santos was evaluated by the  treatment team for stability and plans for continued recovery upon discharge. Lynn Santos 's motivation was an integral factor for scheduling further treatment. Employment, transportation, bed availability, health status, family support, and any pending legal issues were also considered during hospital stay. Pt was offered further treatment options upon discharge including but not limited to Residential, Intensive Outpatient, and Outpatient treatment.  Lynn Santos will follow up with the services as listed below under  Follow Up Information.     Upon completion of this admission the patient was both mentally and medically stable for discharge denying suicidal/homicidal ideation, auditory/visual/tactile hallucinations, delusional thoughts and paranoia.    Lynn Santos responded well to treatment with Effexor 150 mg, Klonopin 1 mg TID without adverse effects.  Pt demonstrated improvement without reported or observed adverse effects to the point of stability appropriate for outpatient management. Pertinent labs include: CMP, for which outpatient follow-up is necessary for lab recheck as mentioned below. Reviewed CBC, CMP, BAL, and UDS + Benzodiazepine; all unremarkable aside from noted exceptions.   Physical Findings: AIMS: Facial and Oral Movements Muscles of Facial Expression: None, normal Lips and Perioral Area: None, normal Jaw: None, normal Tongue: None, normal,Extremity Movements Upper (arms, wrists, hands, fingers): None, normal Lower (legs, knees, ankles, toes): None, normal, Trunk Movements Neck, shoulders, hips: None, normal, Overall Severity Severity of abnormal movements (highest score from questions above): None, normal Incapacitation due to abnormal movements: None, normal Patient's awareness of abnormal movements (rate only patient's report): No Awareness, Dental Status Current problems with teeth and/or dentures?: No Does patient usually wear dentures?: No  CIWA:    COWS:     Musculoskeletal: Strength & Muscle Tone: within normal limits Gait & Station:  seen resting in wheel chair Patient leans: N/A  Psychiatric Specialty Exam: See SRA BY MD  Physical Exam  Vitals reviewed. Constitutional: She appears well-developed.  HENT:  Head: Normocephalic.  Cardiovascular: Normal rate.  Neurological: She is alert.  Psychiatric: She has a normal mood and affect.    Review of Systems  Psychiatric/Behavioral: Negative for depression (stable) and suicidal ideas. The patient is not  nervous/anxious.   All other systems reviewed and are negative.   Blood pressure (!) 167/88, pulse (!) 52, temperature 97.6 F (36.4 C), temperature source Oral, resp. rate 18, height 5\' 4"  (1.626 m), weight 92.5 kg (204 lb).Body mass index is 35.02 kg/m.   Have you used any form of tobacco in the last 30 days? (Cigarettes, Smokeless Tobacco, Cigars, and/or Pipes): No  Has this patient used any form of tobacco in the last 30 days? (Cigarettes, Smokeless Tobacco, Cigars, and/or Pipes) No  Blood Alcohol level:  Lab Results  Component Value Date   ETH <10 81/85/6314    Metabolic Disorder Labs:  No results found for: HGBA1C, MPG No results found for: PROLACTIN No results found for: CHOL, TRIG, HDL, CHOLHDL, VLDL, LDLCALC  See Psychiatric Specialty Exam and Suicide Risk Assessment completed by Attending Physician prior to discharge.  Discharge destination:  Home  Is patient on multiple antipsychotic therapies at discharge:  No   Has Patient had three or more failed trials of antipsychotic monotherapy by history:  No  Recommended Plan for Multiple Antipsychotic Therapies: NA  Discharge Instructions    Diet - low sodium heart healthy   Complete by:  As directed    Increase activity slowly   Complete by:  As directed      Allergies as of 04/17/2017      Reactions   Sulfa Antibiotics  Anaphylaxis   Codeine Nausea And Vomiting   Tetracyclines & Related Nausea Only      Medication List    STOP taking these medications   clonazePAM 2 MG tablet Commonly known as:  KLONOPIN   EPIPEN 0.3 mg/0.3 mL Devi Generic drug:  EPINEPHrine   ondansetron 4 MG disintegrating tablet Commonly known as:  ZOFRAN ODT   oxyCODONE-acetaminophen 5-325 MG tablet Commonly known as:  PERCOCET     TAKE these medications     Indication  estrogens (conjugated) 1.25 MG tablet Commonly known as:  PREMARIN Take 1.25 mg by mouth at bedtime.  Indication:  Postmenopausal Osteoporosis    hydrochlorothiazide 12.5 MG capsule Commonly known as:  MICROZIDE Take 1 capsule (12.5 mg total) by mouth daily. Start taking on:  04/18/2017  Indication:  High Blood Pressure Disorder   INDERAL LA 60 MG 24 hr capsule Generic drug:  propranolol ER Take 60 mg by mouth at bedtime.  Indication:  High Blood Pressure Disorder   levothyroxine 137 MCG tablet Commonly known as:  SYNTHROID, LEVOTHROID Take 137 mcg by mouth daily before breakfast.  Indication:  Underactive Thyroid   lisinopril 20 MG tablet Commonly known as:  PRINIVIL,ZESTRIL Take 20 mg by mouth at bedtime.  Indication:  High Blood Pressure Disorder   venlafaxine XR 150 MG 24 hr capsule Commonly known as:  EFFEXOR-XR Take 1 capsule (150 mg total) by mouth every evening. What changed:  when to take this  Indication:  Generalized Anxiety Disorder, Major Depressive Disorder      Follow-up [facility] Follow up.   Specialty:  Behavioral Health Why:  Medication management/psyciatry appt with Dr. Daron Offer on Tuesday, 05/18/17 at 2:00PM. Please bring insurance card to this appt. Thank you.  Contact information: [place] [phone]          Follow-up recommendations:  Activity:  as tolorated Diet:  heart healthy  Comments:  Take all medications as prescribed. Keep all follow-up appointments as scheduled.  Do not consume alcohol or use illegal drugs while on prescription medications. Report any adverse effects from your medications to your primary care provider promptly.  In the event of recurrent symptoms or worsening symptoms, call 911, a crisis hotline, or go to the nearest emergency department for evaluation.   Signed: Derrill Center, NP 04/17/2017, 8:57 AM   Patient seen, Suicide Assessment Completed.  Disposition Plan Reviewed

## 2017-04-17 NOTE — Progress Notes (Signed)
D. Pt observed in dayroom early, sitting in wheelchair, interacting with peers. Pt calm and cooperative- pleasant during interactions. Patient denies pain  Pt currently denies SI/HI and A/V hallucinations. A. Labs and vitals monitored. Pt compliant with medications. Pt supported emotionally and encouraged to express concerns and ask questions.   R. Pt remains safe with 15 minute checks. Will continue POC.

## 2017-05-04 ENCOUNTER — Telehealth (INDEPENDENT_AMBULATORY_CARE_PROVIDER_SITE_OTHER): Payer: Self-pay | Admitting: Specialist

## 2017-05-04 NOTE — Telephone Encounter (Signed)
Patient called wanting copy of records 05/05/2017 as she will be in town. I called her back and told her we would have them ready and she would need to [facility] records release form when she picks them up.

## 2017-05-18 ENCOUNTER — Ambulatory Visit (HOSPITAL_COMMUNITY): Payer: Self-pay | Admitting: Psychiatry

## 2017-09-27 ENCOUNTER — Emergency Department (HOSPITAL_BASED_OUTPATIENT_CLINIC_OR_DEPARTMENT_OTHER): Payer: Medicare Other

## 2017-09-27 ENCOUNTER — Other Ambulatory Visit: Payer: Self-pay

## 2017-09-27 ENCOUNTER — Encounter (HOSPITAL_BASED_OUTPATIENT_CLINIC_OR_DEPARTMENT_OTHER): Payer: Self-pay

## 2017-09-27 ENCOUNTER — Emergency Department (HOSPITAL_BASED_OUTPATIENT_CLINIC_OR_DEPARTMENT_OTHER)
Admission: EM | Admit: 2017-09-27 | Discharge: 2017-09-28 | Disposition: A | Discharge status: Home / Self Care | Payer: Medicare Other | Attending: Emergency Medicine | Admitting: Emergency Medicine

## 2017-09-27 DIAGNOSIS — Z85828 Personal history of other malignant neoplasm of skin: Secondary | ICD-10-CM | POA: Diagnosis not present

## 2017-09-27 DIAGNOSIS — Z79899 Other long term (current) drug therapy: Secondary | ICD-10-CM | POA: Diagnosis not present

## 2017-09-27 DIAGNOSIS — N201 Calculus of ureter: Secondary | ICD-10-CM | POA: Diagnosis not present

## 2017-09-27 DIAGNOSIS — I10 Essential (primary) hypertension: Secondary | ICD-10-CM | POA: Insufficient documentation

## 2017-09-27 DIAGNOSIS — R1031 Right lower quadrant pain: Secondary | ICD-10-CM | POA: Diagnosis present

## 2017-09-27 LAB — CBC WITH DIFFERENTIAL/PLATELET
Basophils Absolute: 0 10*3/uL (ref 0.0–0.1)
Basophils Relative: 0 %
Eosinophils Absolute: 0.2 10*3/uL (ref 0.0–0.7)
Eosinophils Relative: 2 %
HCT: 38.6 % (ref 36.0–46.0)
Hemoglobin: 12.5 g/dL (ref 12.0–15.0)
Lymphocytes Relative: 19 %
Lymphs Abs: 1.9 10*3/uL (ref 0.7–4.0)
MCH: 29.9 pg (ref 26.0–34.0)
MCHC: 32.4 g/dL (ref 30.0–36.0)
MCV: 92.3 fL (ref 78.0–100.0)
Monocytes Absolute: 0.9 10*3/uL (ref 0.1–1.0)
Monocytes Relative: 9 %
Neutro Abs: 7 10*3/uL (ref 1.7–7.7)
Neutrophils Relative %: 70 %
Platelets: 349 10*3/uL (ref 150–400)
RBC: 4.18 MIL/uL (ref 3.87–5.11)
RDW: 13.5 % (ref 11.5–15.5)
WBC: 9.9 10*3/uL (ref 4.0–10.5)

## 2017-09-27 LAB — COMPREHENSIVE METABOLIC PANEL
ALT: 9 U/L — ABNORMAL LOW (ref 14–54)
AST: 16 U/L (ref 15–41)
Albumin: 3.4 g/dL — ABNORMAL LOW (ref 3.5–5.0)
Alkaline Phosphatase: 78 U/L (ref 38–126)
Anion gap: 7 (ref 5–15)
BUN: 12 mg/dL (ref 6–20)
CO2: 29 mmol/L (ref 22–32)
Calcium: 8.1 mg/dL — ABNORMAL LOW (ref 8.9–10.3)
Chloride: 99 mmol/L — ABNORMAL LOW (ref 101–111)
Creatinine, Ser: 0.89 mg/dL (ref 0.44–1.00)
GFR calc Af Amer: >60 mL/min (ref >60)
GFR calc non Af Amer: >60 mL/min (ref >60)
Glucose, Bld: 108 mg/dL — ABNORMAL HIGH (ref 65–99)
Potassium: 4 mmol/L (ref 3.5–5.1)
Sodium: 135 mmol/L (ref 135–145)
Total Bilirubin: 0.3 mg/dL (ref 0.3–1.2)
Total Protein: 8.2 g/dL — ABNORMAL HIGH (ref 6.5–8.1)

## 2017-09-27 LAB — URINALYSIS, MICROSCOPIC (REFLEX): RBC / HPF: >50 RBC/hpf (ref 0–5)

## 2017-09-27 LAB — URINALYSIS, ROUTINE W REFLEX MICROSCOPIC
Bilirubin Urine: NEGATIVE
Glucose, UA: NEGATIVE mg/dL
Ketones, ur: NEGATIVE mg/dL
Leukocytes, UA: NEGATIVE
Nitrite: NEGATIVE
Protein, ur: >300 mg/dL — AB
Specific Gravity, Urine: >1.03 — ABNORMAL HIGH (ref 1.005–1.030)
pH: 7 (ref 5.0–8.0)

## 2017-09-27 LAB — LIPASE, BLOOD: Lipase: 23 U/L (ref 11–51)

## 2017-09-27 MED ORDER — MORPHINE SULFATE (PF) 4 MG/ML IV SOLN
4.0000 mg | Freq: Once | INTRAVENOUS | Status: AC
  Administered 2017-09-27: 4 mg via INTRAVENOUS
  Filled 2017-09-27: qty 1

## 2017-09-27 MED ORDER — ONDANSETRON HCL 4 MG/2ML IJ SOLN
4.0000 mg | Freq: Once | INTRAMUSCULAR | Status: AC
  Administered 2017-09-27: 4 mg via INTRAVENOUS
  Filled 2017-09-27: qty 2

## 2017-09-27 NOTE — ED Triage Notes (Signed)
C/o lower abd pain x "months"-worse to RLQ today-denies n/v/d-NAD

## 2017-09-27 NOTE — ED Provider Notes (Signed)
[facility] DEPART[facility] Provider Note   CSN: [id number] Arrival date & time: 09/27/17  2026     History   Chief Complaint Chief Complaint  Patient presents with  . Abdominal Pain    HPI Lynn Santos is a 67 y.o. female.  The history is provided by the patient.  She has history of depression, hypertension and comes in complaining of right lower quadrant abdominal pain.  For the last several months, she has been having intermittent pain across the lower abdomen.  This comes and goes without any particular pattern.  It is not affected by eating, bowel movements, urination, movement.  Today, she has pain across the lower abdomen, but much more severe pain in the right lower abdomen.  She rates pain at 8/10.  Nothing makes it better, nothing makes it worse.  She denies associated nausea or vomiting or constipation or diarrhea.  She has had some mild dysuria and slight urinary urgency without any urinary tenesmus.  She denies fever or chills.  Appetite has been diminished.  She has not done anything to treat her pain.  Past Medical History:  Diagnosis Date  . Arthritis   . Brain tumor ([place])   . Depression   . Hypertension   . Postural tremor   . Skin cancer   . Thyroid disease     Patient Active Problem List   Diagnosis Date Noted  . HTN (hypertension) 04/16/2017  . MDD (major depressive disorder), recurrent episode, severe ([place]) 04/14/2017    Past Surgical History:  Procedure Laterality Date  . ABDOMINAL HYSTERECTOMY    . ADENOIDECTOMY    . ANKLE SURGERY    . BACK SURGERY    . BRAIN SURGERY    . CERVICAL SPINE SURGERY    . DEEP BRAIN STIMULATOR PLACEMENT    . LUMBAR SPINE SURGERY    . ROTATOR CUFF REPAIR    . THYROID SURGERY    . TONSILLECTOMY       OB History   None      Home Medications    Prior to Admission medications   Medication Sig Start Date End Date Taking? Authorizing Provider  estrogens, conjugated, (PREMARIN) 1.25 MG tablet  Take 1.25 mg by mouth at bedtime.    [provider]  hydrochlorothiazide (MICROZIDE) 12.5 MG capsule Take 1 capsule (12.5 mg total) by mouth daily. 04/18/17   Derrill Center, NP  levothyroxine (SYNTHROID, LEVOTHROID) 137 MCG tablet Take 137 mcg by mouth daily before breakfast.  03/11/16   [provider]  lisinopril (PRINIVIL,ZESTRIL) 20 MG tablet Take 20 mg by mouth at bedtime.    [provider]  propranolol ER (INDERAL LA) 60 MG 24 hr capsule Take 60 mg by mouth at bedtime.    [provider]  venlafaxine XR (EFFEXOR-XR) 150 MG 24 hr capsule Take 1 capsule (150 mg total) by mouth every evening. 04/17/17   Derrill Center, NP    Family History No family history on file.  Social History Social History   Tobacco Use  . Smoking status: Never Smoker  . Smokeless tobacco: Never Used  Substance Use Topics  . Alcohol use: No  . Drug use: No     Allergies   Sulfa antibiotics; Codeine; and Tetracyclines & related   Review of Systems Review of Systems  All other systems reviewed and are negative.    Physical Exam Updated Vital Signs BP (!) 219/111 (BP Location: Left Arm)   Pulse 87  Temp 98.7 F (37.1 C) (Oral)   Resp 20   Ht 5\' 5"  (1.651 m)   Wt 96.6 kg (213 lb)   SpO2 98%   BMI 35.45 kg/m   Physical Exam  Nursing note and vitals reviewed.  67 year old female, resting comfortably and in no acute distress. Vital signs are significant for elevated blood pressure. Oxygen saturation is 98%, which is normal. Head is normocephalic and atraumatic. PERRLA, EOMI. Oropharynx is clear. Neck is nontender and supple without adenopathy or JVD. Back is nontender and there is no CVA tenderness. Lungs are clear without rales, wheezes, or rhonchi. Chest is nontender. Heart has regular rate and rhythm without murmur. Abdomen is soft, flat, with moderate tenderness in the right lower quadrant fairly well localized to McBurney's area.  There is no  rebound or guarding.  There are no masses or hepatosplenomegaly and peristalsis is slightly hypoactive. Extremities have no cyanosis or edema, full range of motion is present. Skin is warm and dry without rash. Neurologic: Mental status is normal, cranial nerves are intact, there are no motor or sensory deficits.  ED Treatments / Results  Labs (all labs ordered are listed, but only abnormal results are displayed) Labs Reviewed  URINALYSIS, ROUTINE W REFLEX MICROSCOPIC - Abnormal; Notable for the following components:      Result Value   APPearance CLOUDY (*)    Specific Gravity, Urine >1.030 (*)    Hgb urine dipstick LARGE (*)    Protein, ur >300 (*)    All other components within normal limits  URINALYSIS, MICROSCOPIC (REFLEX) - Abnormal; Notable for the following components:   Bacteria, UA RARE (*)    All other components within normal limits  COMPREHENSIVE METABOLIC PANEL - Abnormal; Notable for the following components:   Chloride 99 (*)    Glucose, Bld 108 (*)    Calcium 8.1 (*)    Total Protein 8.2 (*)    Albumin 3.4 (*)    ALT 9 (*)    All other components within normal limits  CBC WITH DIFFERENTIAL/PLATELET  LIPASE, BLOOD   Radiology Ct Renal Stone Study  Result Date: 09/28/2017 CLINICAL DATA:  Flank pain. EXAM: CT ABDOMEN AND PELVIS WITHOUT CONTRAST TECHNIQUE: Multidetector CT imaging of the abdomen and pelvis was performed following the standard protocol without IV contrast. COMPARISON:  CT 10/31/2016 FINDINGS: Lower chest: Minimal lingular atelectasis or scarring. No pleural fluid or consolidation. Persistent wall thickening the distal esophagus with tiny hiatal hernia. Hepatobiliary: Decreased hepatic density consistent with steatosis. No discrete focal lesion on noncontrast exam. Scattered hepatic granuloma. Gallbladder physiologically distended, no calcified stone. No biliary dilatation. Pancreas: No ductal dilatation or inflammation. Spleen: Normal in size without  focal abnormality. Adrenals/Urinary Tract: Normal adrenal glands. 5 mm stone at the right ureteropelvic junction with mild hydronephrosis and perinephric edema. The right ureter just distal to the stone is also prominent with periureteric stranding. Ureter seems more normal caliber distally at the pelvic brim. No distal obstructing stone. No additional calculi. Partially exophytic cyst from the mid left kidney. The left ureter is decompressed. Urinary bladder is partially distended with mild wall thickening. Stomach/Bowel: Distal colonic diverticulosis without diverticulitis. Normal appendix, for example image 31 series 5. No small bowel inflammation, obstruction or wall thickening. Stomach is nondistended. Vascular/Lymphatic: Mild aorta bi-iliac atherosclerosis. Small retroperitoneal nodes not enlarged by size criteria. Reproductive: Status post hysterectomy. No adnexal masses. Other: Generalized right retroperitoneal stranding along the course of the ureter. No free fluid or free air. Linear  soft tissue densities/edema in the anterior and posterior abdominal wall subcutaneous tissues. No focal fluid collection. Musculoskeletal: Similar-appearing degenerative disc disease at L4-L5 to prior exam. There are no acute or suspicious osseous abnormalities. Scattered bone islands in the pelvis. IMPRESSION: 1. Obstructing 5 mm stone at the right ureteropelvic junction with mild hydronephrosis. The ureter just distal to the stone is also dilated with periureteric stranding. In conjunction with bladder wall thickening and perivesicular stranding findings are suspicious for concurrent cystitis and ascending urinary tract infection. 2. Incidental findings of colonic diverticulosis without diverticulitis. Hepatic steatosis. Aortic Atherosclerosis (ICD10-I70.0). 3. Mild persistent distal esophageal wall thickening. Electronically Signed   By: Jeb Levering M.D.   On: 09/28/2017 00:44     Procedures Procedures  Medications Ordered in ED Medications  morphine 4 MG/ML injection 4 mg (has no administration in time range)  morphine 4 MG/ML injection 4 mg (4 mg Intravenous Given 09/27/17 2325)  ondansetron (ZOFRAN) injection 4 mg (4 mg Intravenous Given 09/27/17 2326)     Initial Impression / Assessment and Plan / ED Course  I have reviewed the triage vital signs and the nursing notes.  Pertinent labs & imaging results that were available during my care of the patient were reviewed by me and considered in my medical decision making (see chart for details).  Right lower quadrant pain and tenderness suspicious for appendicitis.  Consider urolithiasis, diverticulitis, pancreatitis.  Urinalysis shows no evidence of UTI, but does have evidence of microscopic hematuria which would favor urolithiasis.  Old records are reviewed, and she has no relevant past visits.  No prior abdominal imaging on record.  She will be sent for renal stone protocol CT scan.  She will be given morphine for pain.  Labs are unremarkable. CT shows 5 mm calculus in proximal right ureter. CT suggestion of cystitis not confirmed by urinalysis. Repeat blood pressure is much improved, but still elevated. She is discharged with referral to urology for possible lithotripsy. Prescriptions given for oxycodone-acetaminophen, tamsulosin, and ondansetron. Return precautions discussed.  Final Clinical Impressions(s) / ED Diagnoses   Final diagnoses:  Ureterolithiasis    ED Discharge Orders        Ordered    oxyCODONE-acetaminophen (PERCOCET) 5-325 MG tablet  Every 4 hours PRN     09/28/17 0113    ondansetron (ZOFRAN) 4 MG tablet  Every 6 hours PRN     09/28/17 0113    tamsulosin (FLOMAX) 0.4 MG CAPS capsule  Daily     31/49/70 2637       Delora Fuel, MD 85/88/50 [id number]

## 2017-09-27 NOTE — ED Notes (Signed)
ED Provider at bedside. 

## 2017-09-27 NOTE — ED Notes (Signed)
Patient transported to CT 

## 2017-09-27 NOTE — ED Notes (Signed)
Pt is c/o lower abd pain that she states she has had for months  Worse since yesterday  Pain is worse on the right side today  Pt states she knows she has a UTI but she does not feel the pain in her lower abdomen is from that because it has been hurting for months

## 2017-09-28 DIAGNOSIS — N201 Calculus of ureter: Secondary | ICD-10-CM | POA: Diagnosis not present

## 2017-09-28 MED ORDER — OXYCODONE-ACETAMINOPHEN 5-325 MG PO TABS
1.0000 | ORAL_TABLET | ORAL | 0 refills | Status: AC | PRN
Start: 2017-09-28 — End: ?

## 2017-09-28 MED ORDER — MORPHINE SULFATE (PF) 4 MG/ML IV SOLN
4.0000 mg | Freq: Once | INTRAVENOUS | Status: AC
  Administered 2017-09-28: 4 mg via INTRAVENOUS
  Filled 2017-09-28: qty 1

## 2017-09-28 MED ORDER — TAMSULOSIN HCL 0.4 MG PO CAPS: 0.4000 mg | ORAL_CAPSULE | Freq: Every day | ORAL | 0 refills | Status: AC

## 2017-09-28 MED ORDER — ONDANSETRON HCL 4 MG PO TABS: 4.0000 mg | ORAL_TABLET | Freq: Four times a day (QID) | ORAL | 0 refills | Status: AC | PRN

## 2017-09-28 NOTE — Discharge Instructions (Signed)
Return immediately if you start running a fever.  Do not take any NSAID's (ibuprofen, naproxen) - if you take them, it will delay your ability to get lithotripsy.

## 2017-09-28 NOTE — ED Notes (Signed)
ED Provider at bedside. 

## 2018-01-11 ENCOUNTER — Emergency Department (HOSPITAL_BASED_OUTPATIENT_CLINIC_OR_DEPARTMENT_OTHER)
Admission: EM | Admit: 2018-01-11 | Discharge: 2018-01-11 | Disposition: A | Discharge status: Home / Self Care | Payer: Medicare Other | Attending: Emergency Medicine | Admitting: Emergency Medicine

## 2018-01-11 ENCOUNTER — Emergency Department (HOSPITAL_BASED_OUTPATIENT_CLINIC_OR_DEPARTMENT_OTHER): Payer: Medicare Other

## 2018-01-11 ENCOUNTER — Other Ambulatory Visit: Payer: Self-pay

## 2018-01-11 ENCOUNTER — Encounter (HOSPITAL_BASED_OUTPATIENT_CLINIC_OR_DEPARTMENT_OTHER): Payer: Self-pay | Admitting: *Deleted

## 2018-01-11 DIAGNOSIS — Z85828 Personal history of other malignant neoplasm of skin: Secondary | ICD-10-CM | POA: Insufficient documentation

## 2018-01-11 DIAGNOSIS — I10 Essential (primary) hypertension: Secondary | ICD-10-CM | POA: Diagnosis not present

## 2018-01-11 DIAGNOSIS — Z79899 Other long term (current) drug therapy: Secondary | ICD-10-CM | POA: Diagnosis not present

## 2018-01-11 DIAGNOSIS — R05 Cough: Secondary | ICD-10-CM | POA: Diagnosis present

## 2018-01-11 DIAGNOSIS — J209 Acute bronchitis, unspecified: Secondary | ICD-10-CM | POA: Diagnosis not present

## 2018-01-11 MED ORDER — LEVOFLOXACIN 500 MG PO TABS: 500.0000 mg | ORAL_TABLET | Freq: Every day | ORAL | 0 refills | Status: AC

## 2018-01-11 MED ORDER — LEVOFLOXACIN 500 MG PO TABS
500.0000 mg | ORAL_TABLET | Freq: Once | ORAL | Status: AC
  Administered 2018-01-11: 500 mg via ORAL
  Filled 2018-01-11: qty 1

## 2018-01-11 MED ORDER — ALBUTEROL SULFATE HFA 108 (90 BASE) MCG/ACT IN AERS
2.0000 | INHALATION_SPRAY | RESPIRATORY_TRACT | Status: DC
  Administered 2018-01-11: 2 via RESPIRATORY_TRACT
  Filled 2018-01-11: qty 6.7

## 2018-01-11 NOTE — ED Triage Notes (Signed)
Productive cough with yellow sputum x 2 days. Sore throat and body aches.

## 2018-01-11 NOTE — ED Provider Notes (Addendum)
[facility] EMERGENCY DEPARTMENT Provider Note   CSN: [id number] Arrival date & time: 01/11/18  1241     History   Chief Complaint Chief Complaint  Patient presents with  . Cough    HPI Lynn Santos is a 67 y.o. female.  HPI 67 year old female presents to the emergency department with complaints of productive cough and chills and myalgias and low-grade fever over the past 2 days.  No significant shortness of breath.  No fevers or chills.  Cough is keeping her up at night making sleep difficult.  No documented fever at home.  Symptoms are mild to moderate in severity.  Denies abdominal pain.  No chest pain.   Past Medical History:  Diagnosis Date  . Arthritis   . Brain tumor ([place])   . Depression   . Hypertension   . Postural tremor   . Skin cancer   . Thyroid disease     Patient Active Problem List   Diagnosis Date Noted  . HTN (hypertension) 04/16/2017  . MDD (major depressive disorder), recurrent episode, severe ([place]) 04/14/2017    Past Surgical History:  Procedure Laterality Date  . ABDOMINAL HYSTERECTOMY    . ADENOIDECTOMY    . ANKLE SURGERY    . BACK SURGERY    . BRAIN SURGERY    . CERVICAL SPINE SURGERY    . DEEP BRAIN STIMULATOR PLACEMENT    . LUMBAR SPINE SURGERY    . ROTATOR CUFF REPAIR    . THYROID SURGERY    . TONSILLECTOMY       OB History   None      Home Medications    Prior to Admission medications   Medication Sig Start Date End Date Taking? Authorizing Provider  estrogens, conjugated, (PREMARIN) 1.25 MG tablet Take 1.25 mg by mouth at bedtime.    [provider]  hydrochlorothiazide (MICROZIDE) 12.5 MG capsule Take 1 capsule (12.5 mg total) by mouth daily. 04/18/17   Derrill Center, NP    01/11/18   Jola Schmidt, MD  levothyroxine (SYNTHROID, LEVOTHROID) 137 MCG tablet Take 137 mcg by mouth daily before breakfast.  03/11/16   [provider]  lisinopril (PRINIVIL,ZESTRIL) 20 MG tablet Take 20 mg by  mouth at bedtime.    [provider]  ondansetron (ZOFRAN) 4 MG tablet Take 1 tablet (4 mg total) by mouth every 6 (six) hours as needed for nausea or vomiting. 0/86/57   Delora Fuel, MD  oxyCODONE-acetaminophen (PERCOCET) 5-325 MG tablet Take 1 tablet by mouth every 4 (four) hours as needed for moderate pain. 8/46/96   Delora Fuel, MD  propranolol ER (INDERAL LA) 60 MG 24 hr capsule Take 60 mg by mouth at bedtime.    [provider]  tamsulosin (FLOMAX) 0.4 MG CAPS capsule Take 1 capsule (0.4 mg total) by mouth daily. 2/95/28   Delora Fuel, MD  venlafaxine XR (EFFEXOR-XR) 150 MG 24 hr capsule Take 1 capsule (150 mg total) by mouth every evening. 04/17/17   Derrill Center, NP    Family History No family history on file.  Social History Social History   Tobacco Use  . Smoking status: Never Smoker  . Smokeless tobacco: Never Used  Substance Use Topics  . Alcohol use: No  . Drug use: No     Allergies   Sulfa antibiotics; Codeine; and Tetracyclines & related   Review of Systems Review of Systems   Physical Exam Updated Vital Signs BP (!) 157/82 (BP Location: Right Arm)  Pulse 91   Temp 99 F (37.2 C) (Oral)   Resp 20   Ht 5' 3.5" (1.613 m)   Wt 94.3 kg   SpO2 95%   BMI 36.27 kg/m   Physical Exam  Constitutional: She is oriented to person, place, and time. She appears well-developed and well-nourished. No distress.  HENT:  Head: Normocephalic and atraumatic.  Eyes: EOM are normal.  Neck: Normal range of motion.  Cardiovascular: Normal rate, regular rhythm and normal heart sounds.  Pulmonary/Chest: Effort normal and breath sounds normal.  Abdominal: Soft. She exhibits no distension. There is no tenderness.  Musculoskeletal: Normal range of motion.  Neurological: She is alert and oriented to person, place, and time.  Skin: Skin is warm and dry.  Psychiatric: She has a normal mood and affect. Judgment normal.  Nursing note and vitals  reviewed.    ED Treatments / Results  Labs (all labs ordered are listed, but only abnormal results are displayed) Labs Reviewed - No data to display  EKG None  Radiology Dg Chest 2 View  Result Date: 01/11/2018 CLINICAL DATA:  Cough and shortness of breath for 3 days EXAM: CHEST - 2 VIEW COMPARISON:  03/30/2012 FINDINGS: Cardiac shadow is stable. Stimulator pack is noted over the left chest. Lungs are well aerated bilaterally without focal infiltrate or sizable effusion. No acute bony abnormality is noted. IMPRESSION: No acute abnormality noted. Electronically Signed   By: Inez Catalina M.D.   On: 01/11/2018 14:03    Procedures Procedures (including critical care time)  Medications Ordered in ED Medications  albuterol (PROVENTIL HFA;VENTOLIN HFA) 108 (90 Base) MCG/ACT inhaler 2 puff (2 puffs Inhalation Given 01/11/18 1340)  levofloxacin (LEVAQUIN) tablet 500 mg (500 mg Oral Given 01/11/18 1341)     Initial Impression / Assessment and Plan / ED Course  I have reviewed the triage vital signs and the nursing notes.  Pertinent labs & imaging results that were available during my care of the patient were reviewed by me and considered in my medical decision making (see chart for details).     Acute bronchitis with possibly developing community-acquired pneumonia.  No hypoxia.  Vital signs are stable.  Recommended ongoing oral hydration at home.  We will presumptively treat the patient for developing pneumonia with flora quinolone oral antibiotics.  Patient is encouraged to return to the ER for new or worsening symptoms.  Final Clinical Impressions(s) / ED Diagnoses   Final diagnoses:  Acute bronchitis, unspecified organism    ED Discharge Orders         Ordered    levofloxacin (LEVAQUIN) 500 MG tablet  Daily     01/11/18 [place], Luv Mish, MD 01/11/18 [place], Inetta Dicke, MD 02/01/18 [phone]

## 2018-01-11 NOTE — ED Notes (Signed)
ED Provider at bedside. 

## 2020-05-09 ENCOUNTER — Other Ambulatory Visit (HOSPITAL_BASED_OUTPATIENT_CLINIC_OR_DEPARTMENT_OTHER): Payer: Self-pay | Admitting: Physician Assistant

## 2020-05-09 ENCOUNTER — Emergency Department (HOSPITAL_BASED_OUTPATIENT_CLINIC_OR_DEPARTMENT_OTHER)
Admission: EM | Admit: 2020-05-09 | Discharge: 2020-05-09 | Disposition: A | Discharge status: Home / Self Care | Payer: Medicare Other | Attending: Emergency Medicine | Admitting: Emergency Medicine

## 2020-05-09 ENCOUNTER — Encounter (HOSPITAL_BASED_OUTPATIENT_CLINIC_OR_DEPARTMENT_OTHER): Payer: Self-pay

## 2020-05-09 ENCOUNTER — Other Ambulatory Visit: Payer: Self-pay

## 2020-05-09 ENCOUNTER — Emergency Department (HOSPITAL_BASED_OUTPATIENT_CLINIC_OR_DEPARTMENT_OTHER): Payer: Medicare Other

## 2020-05-09 DIAGNOSIS — I10 Essential (primary) hypertension: Secondary | ICD-10-CM | POA: Insufficient documentation

## 2020-05-09 DIAGNOSIS — Z85841 Personal history of malignant neoplasm of brain: Secondary | ICD-10-CM | POA: Insufficient documentation

## 2020-05-09 DIAGNOSIS — M62838 Other muscle spasm: Secondary | ICD-10-CM | POA: Diagnosis not present

## 2020-05-09 DIAGNOSIS — Z79899 Other long term (current) drug therapy: Secondary | ICD-10-CM | POA: Insufficient documentation

## 2020-05-09 DIAGNOSIS — M542 Cervicalgia: Secondary | ICD-10-CM | POA: Diagnosis present

## 2020-05-09 DIAGNOSIS — Z85828 Personal history of other malignant neoplasm of skin: Secondary | ICD-10-CM | POA: Diagnosis not present

## 2020-05-09 MED ORDER — LIDOCAINE 5 % EX PTCH: 1.0000 | MEDICATED_PATCH | CUTANEOUS | 0 refills | Status: DC

## 2020-05-09 MED ORDER — LIDOCAINE 5 % EX PTCH
1.0000 | MEDICATED_PATCH | CUTANEOUS | Status: DC
  Administered 2020-05-09: 1 via TRANSDERMAL
  Filled 2020-05-09 (×2): qty 1

## 2020-05-09 MED ORDER — TIZANIDINE HCL 2 MG PO TABS: 2.0000 mg | ORAL_TABLET | Freq: Once | ORAL | 0 refills | Status: DC

## 2020-05-09 MED ORDER — LIDOCAINE 5 % EX PTCH: 1.0000 | MEDICATED_PATCH | CUTANEOUS | 0 refills | Status: AC

## 2020-05-09 MED FILL — tiZANidine HCL 2 MG TABS: 2 | 20 days supply | Qty: 20 | Fill #0

## 2020-05-09 NOTE — ED Provider Notes (Signed)
[facility] EMERGENCY DEPARTMENT Provider Note   CSN: [id number] Arrival date & time: 05/09/20  1248     History Chief Complaint  Patient presents with  . Neck Injury    Lynn Santos is a 70 y.o. female.  HPI 70 year old female with a history of brain tumor, depression, anxiety, hypertension presents to the ER with complaints of neck pain times a week and a half.  She has been visiting a friend here and woke up 1 morning with left-sided neck pain.  Worse with movement.  Feels as though it will catch and cause a spasm with certain movements.  She has been taking Tylenol and tramadol with little relief as well as been applying warm compresses.  Denies any numbness or tingling in her extremities.  No chest pain, dizziness, syncope, nausea.    Past Medical History:  Diagnosis Date  . Arthritis   . Brain tumor ([place])   . Depression   . Hypertension   . Postural tremor   . Skin cancer   . Thyroid disease     Patient Active Problem List   Diagnosis Date Noted  . HTN (hypertension) 04/16/2017  . MDD (major depressive disorder), recurrent episode, severe ([place]) 04/14/2017    Past Surgical History:  Procedure Laterality Date  . ABDOMINAL HYSTERECTOMY    . ADENOIDECTOMY    . ANKLE SURGERY    . BACK SURGERY    . BRAIN SURGERY    . CERVICAL SPINE SURGERY    . DEEP BRAIN STIMULATOR PLACEMENT    . LUMBAR SPINE SURGERY    . ROTATOR CUFF REPAIR    . THYROID SURGERY    . TONSILLECTOMY       OB History   No obstetric history on file.     History reviewed. No pertinent family history.  Social History   Tobacco Use  . Smoking status: Never Smoker  . Smokeless tobacco: Never Used  Substance Use Topics  . Alcohol use: No  . Drug use: No    Home Medications Prior to Admission medications   Medication Sig Start Date End Date Taking? Authorizing Provider  lidocaine (LIDODERM) 5 % Place 1 patch onto the skin daily. Remove & Discard patch within 12 hours or as  directed by MD 05/09/20  Yes Garald Balding, PA-C  tiZANidine (ZANAFLEX) 2 MG tablet Take 1 tablet (2 mg total) by mouth once for 1 dose. 05/09/20 05/09/20 Yes Garald Balding, PA-C  estrogens, conjugated, (PREMARIN) 1.25 MG tablet Take 1.25 mg by mouth at bedtime.    [provider]  hydrochlorothiazide (MICROZIDE) 12.5 MG capsule Take 1 capsule (12.5 mg total) by mouth daily. 04/18/17   Derrill Center, NP  levofloxacin (LEVAQUIN) 500 MG tablet Take 1 tablet (500 mg total) by mouth daily. 01/11/18   Jola Schmidt, MD  levothyroxine (SYNTHROID, LEVOTHROID) 137 MCG tablet Take 137 mcg by mouth daily before breakfast.  03/11/16   [provider]  lisinopril (PRINIVIL,ZESTRIL) 20 MG tablet Take 20 mg by mouth at bedtime.    [provider]  ondansetron (ZOFRAN) 4 MG tablet Take 1 tablet (4 mg total) by mouth every 6 (six) hours as needed for nausea or vomiting. [id number]   Delora Fuel, MD  oxyCODONE-acetaminophen (PERCOCET) 5-325 MG tablet Take 1 tablet by mouth every 4 (four) hours as needed for moderate pain. [id number]   Delora Fuel, MD  propranolol ER (INDERAL LA) 60 MG 24 hr capsule Take 60 mg by mouth at bedtime.  [provider]  tamsulosin (FLOMAX) 0.4 MG CAPS capsule Take 1 capsule (0.4 mg total) by mouth daily. 1/60/10   Delora Fuel, MD  venlafaxine XR (EFFEXOR-XR) 150 MG 24 hr capsule Take 1 capsule (150 mg total) by mouth every evening. 04/17/17   Derrill Center, NP    Allergies    Sulfa antibiotics, Codeine, and Tetracyclines & related  Review of Systems   Review of Systems  Constitutional: Negative for fever.  Respiratory: Negative for shortness of breath.   Cardiovascular: Negative for chest pain.  Musculoskeletal: Positive for neck pain and neck stiffness.  Neurological: Negative for dizziness, weakness and numbness.    Physical Exam Updated Vital Signs BP 108/68 (BP Location: Left Arm)   Pulse (!) 59   Temp 98.4 F (36.9 C) (Oral)    Resp 16   Ht 5\' 4"  (1.626 m)   Wt 99.8 kg   SpO2 98%   BMI 37.76 kg/m   Physical Exam Vitals and nursing note reviewed.  Constitutional:      General: She is not in acute distress.    Appearance: She is well-developed and well-nourished. She is not ill-appearing or diaphoretic.  HENT:     Head: Normocephalic and atraumatic.  Eyes:     Conjunctiva/sclera: Conjunctivae normal.  Cardiovascular:     Rate and Rhythm: Normal rate and regular rhythm.     Heart sounds: No murmur heard.   Pulmonary:     Effort: Pulmonary effort is normal. No respiratory distress.     Breath sounds: Normal breath sounds.  Abdominal:     Palpations: Abdomen is soft.     Tenderness: There is no abdominal tenderness.  Musculoskeletal:        General: No edema.     Cervical back: Neck supple.     Comments: No midline tenderness of the C, T, L-spine tenderness.  Minimal tenderness to the paraspinal muscles of the C-spine on the left.  No overlying skin changes, erythema.  No step-offs or crepitus.  Slightly limited range of motion of the neck to the left, however moving and neck without difficulty.  Patient on her phone when I entered the room.  5/5 strength in upper and lower extremities bilaterally.  Ambulating without difficulty.  Skin:    General: Skin is warm and dry.  Neurological:     General: No focal deficit present.     Mental Status: She is alert and oriented to person, place, and time.     Cranial Nerves: No cranial nerve deficit.     Sensory: No sensory deficit.     Motor: No weakness.     Comments: Pupils equal and reactive, no word slurring, facial droop  Psychiatric:        Mood and Affect: Mood and affect and mood normal.        Behavior: Behavior normal.     ED Results / Procedures / Treatments   Labs (all labs ordered are listed, but only abnormal results are displayed) Labs Reviewed - No data to display  EKG None  Radiology DG Cervical Spine Complete  Result Date:  05/09/2020 CLINICAL DATA:  Left upper neck pain. EXAM: CERVICAL SPINE - COMPLETE 4+ VIEW COMPARISON:  None. FINDINGS: There is no evidence of an acute cervical spine fracture or prevertebral soft tissue swelling. Approximately 1 mm to 2 mm retrolisthesis of the C5 vertebral body is noted on C6. Mild to moderate severity endplate sclerosis is seen at the levels of C5-C6 and C6-C7.  Mild intervertebral disc space narrowing is also seen at these levels. 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 Electronically Signed   By: Virgina Norfolk M.D.   On: 05/09/2020 17:21    Procedures Procedures (including critical care time)  Medications Ordered in ED Medications  lidocaine (LIDODERM) 5 % 1 patch (1 patch Transdermal Patch Applied 05/09/20 1646)    ED Course  I have reviewed the triage vital signs and the nursing notes.  Pertinent labs & imaging results that were available during my care of the patient were reviewed by me and considered in my medical decision making (see chart for details).    MDM Rules/Calculators/A&P                          70 year old female with complaints of neck pain.  No midline tenderness to the C, T, L-spine tenderness, no step-offs, crepitus.  Slightly limited range of motion of the neck to the left, however still moving neck without difficulty.  Personally witnessed a spasm on exam.  She has equal strength in upper and lower extremities bilaterally.  No neurologic deficits.  Vitals reassuring.  Low suspicion for cervical disc herniation, dissection as this has been ongoing for a week and a half as well as a benign neuro exam.  X-ray moderate generative changes at C5-6 and C5 6-7.  Will prescribe Toradol, provided gentle stretching exercises, Lidoderm patch.  Encouraged PCP follow-up and explained that she may require further evaluation such as CT or MRI in outpatient setting, as well as physical therapy.  Return precautions  discussed.  She voiced understanding is agreeable.  Discussed the case w/ Dr. Darl Householder who is agreeable to the above plan and disposition  Final Clinical Impression(s) / ED Diagnoses Final diagnoses:  Muscle spasms of neck    Rx / DC Orders ED Discharge Orders         Ordered    tiZANidine (ZANAFLEX) 2 MG tablet   Once        05/09/20 1710    lidocaine (LIDODERM) 5 %  Every 24 hours        05/09/20 1710           Lyndel Safe 05/09/20 1734    Drenda Freeze, MD 05/10/20 [phone]

## 2020-05-09 NOTE — Discharge Instructions (Addendum)
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  This is not acutely life-threatening, though may the cause of your muscle spas. Please continue to take Tylenol for pain.  You may take the muscle relaxer Zanaflex, take this medication at night and do not drink or drive on the medication as it can make you sleepy.  You may also apply the Lidoderm patches to the neck.  Apply heat or ice to the neck. You may require further treatment or physical therapy, which can be provided by your primary care doctor.  Please make sure to follow-up with your primary care doctor if your symptoms continue.  Return to the ER if you have any numbness or weakness in your extremities, any new or worsening symptoms.

## 2020-05-09 NOTE — ED Triage Notes (Signed)
Pt reports neck pain since 1.5 weeks ago, pt states the pain is worse on the L side of his neck.

## 2021-07-16 IMAGING — CR DG CERVICAL SPINE COMPLETE 4+V
6 series · 6 of 6 positions shown · non-contrast
Comparison: None.

CLINICAL DATA: Left upper neck pain.

EXAM:
CERVICAL SPINE - COMPLETE 4+ VIEW

[w c-spine lat]
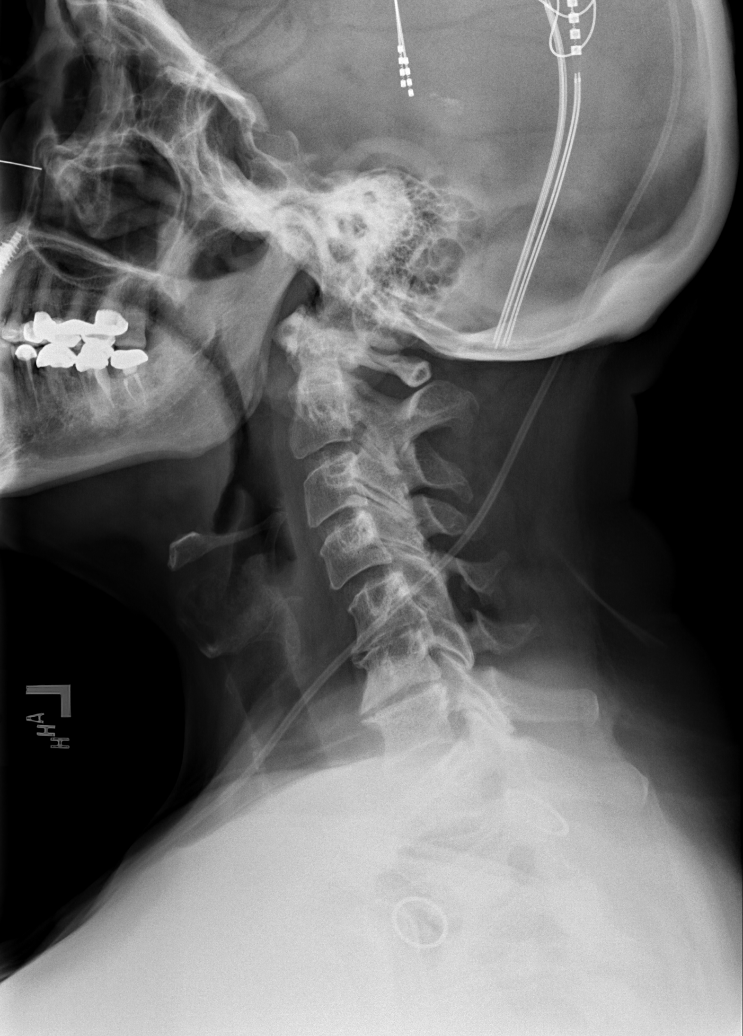

[w c-spine oblique (1 of 2)]
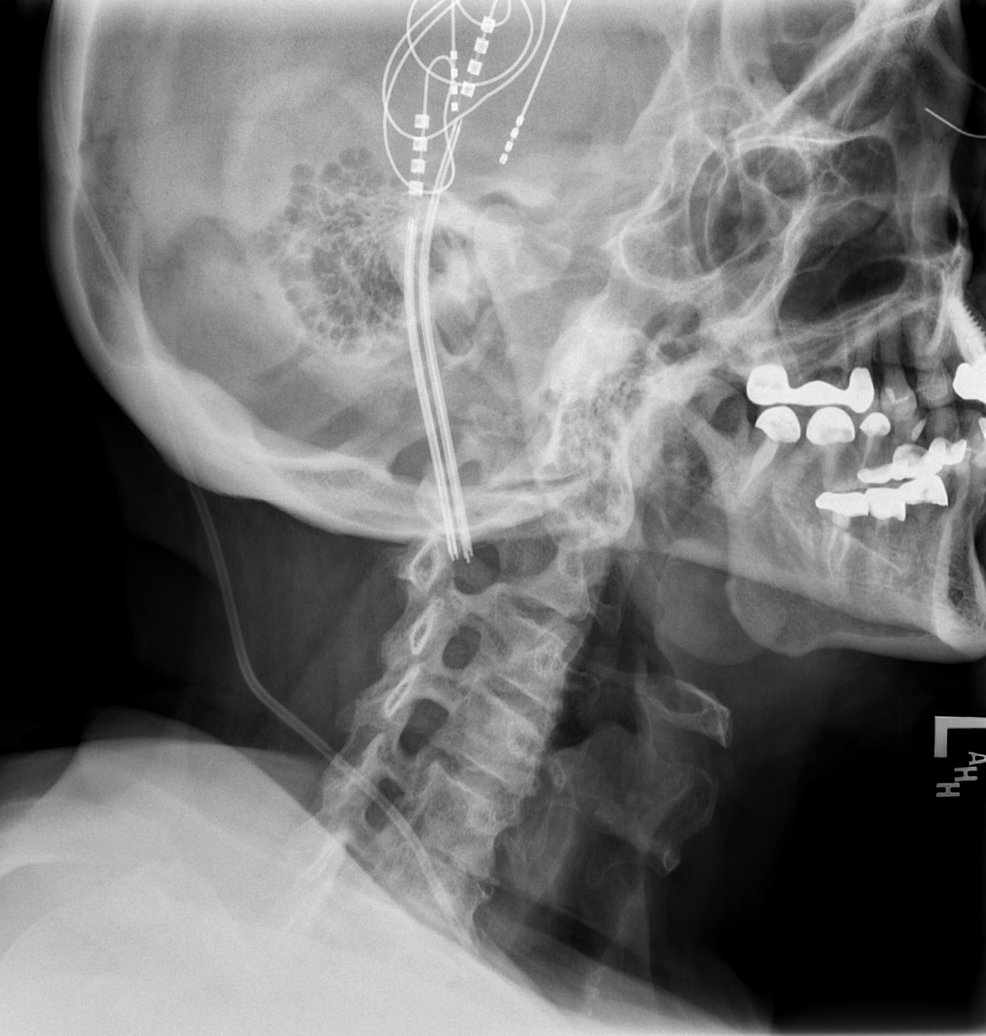

[w c-spine oblique (2 of 2)]
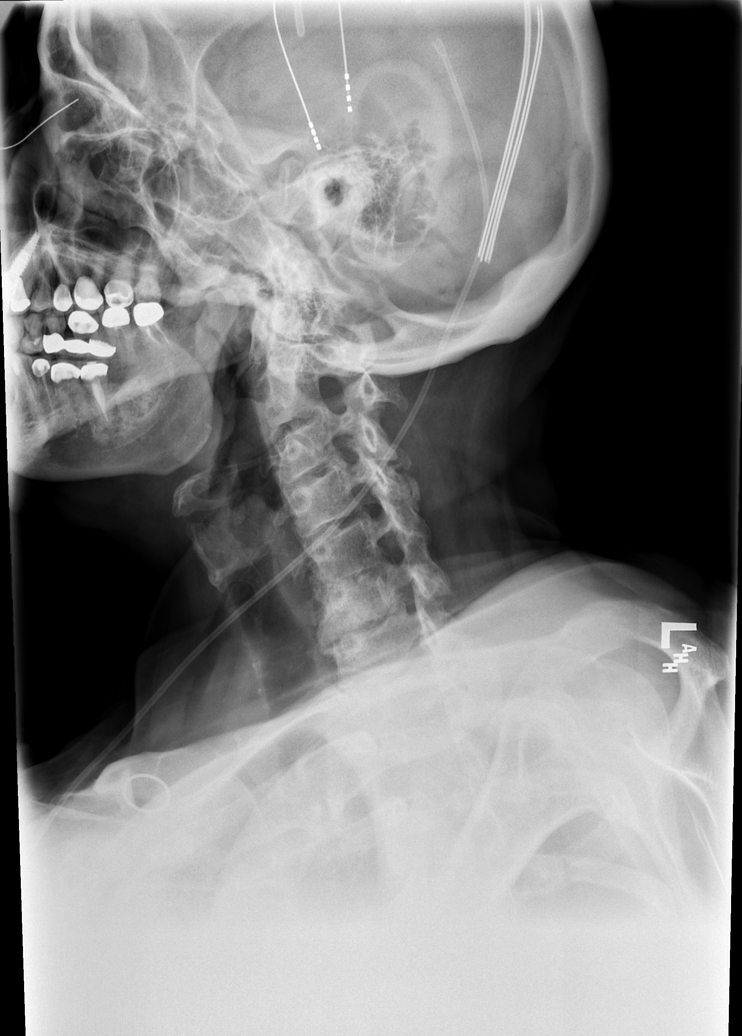

[w c-spine a.p. *]
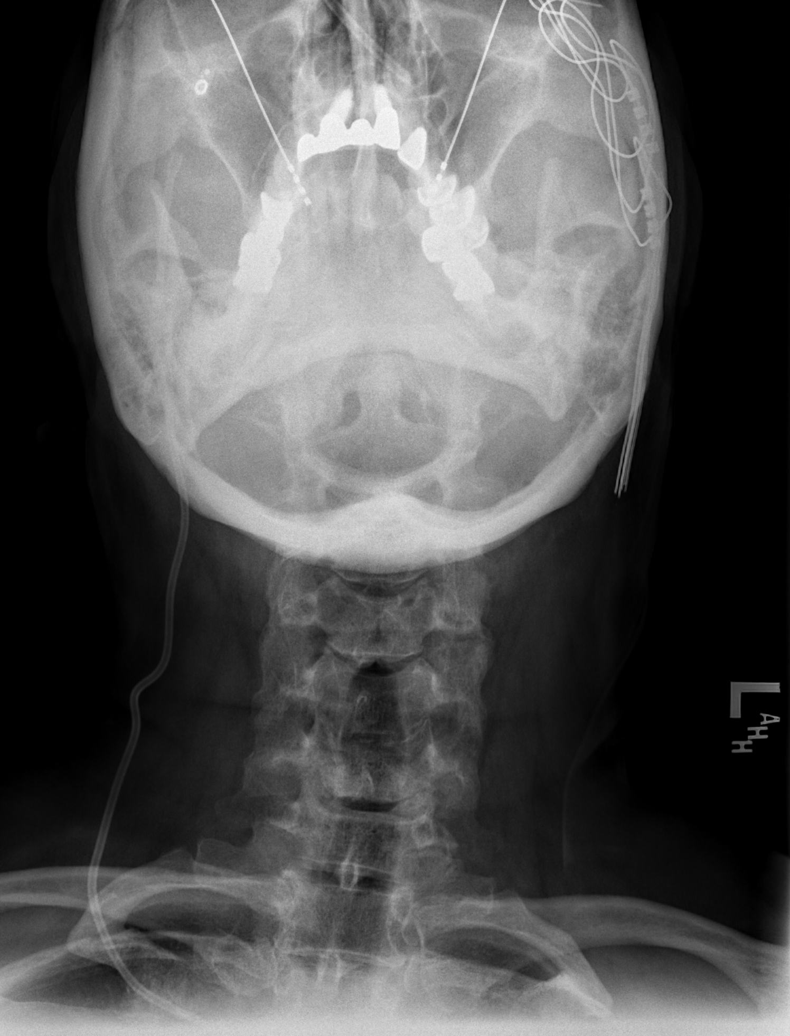

[w c-spine odontoid]
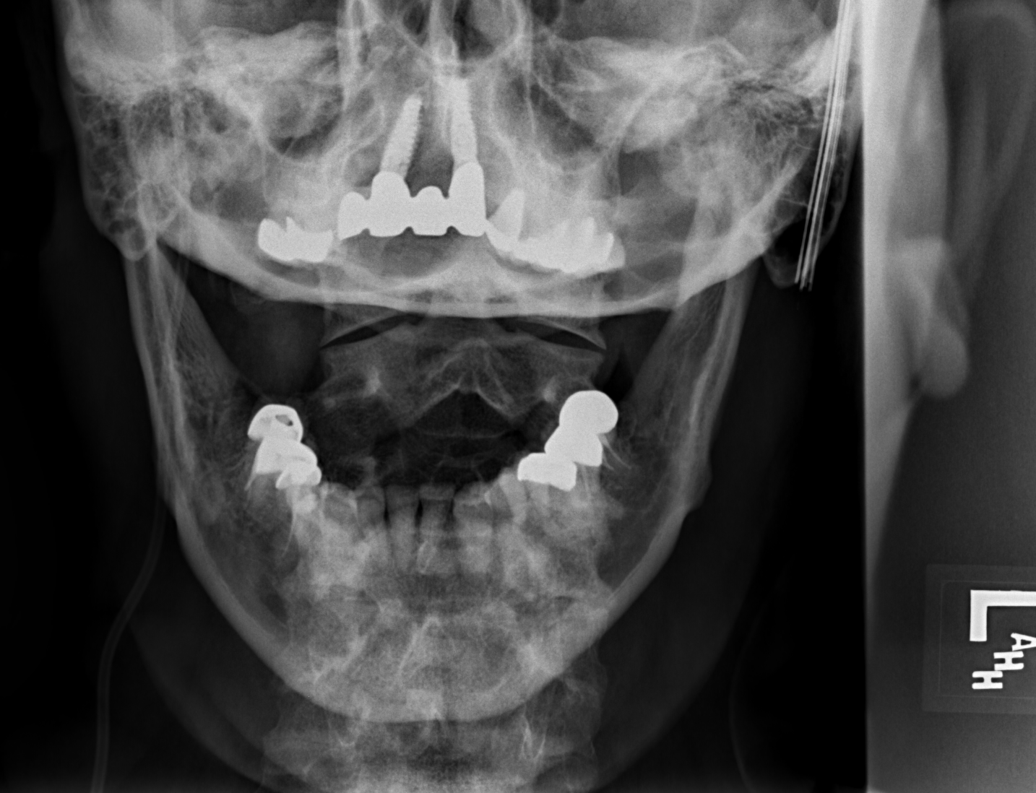

[w swimmers view]
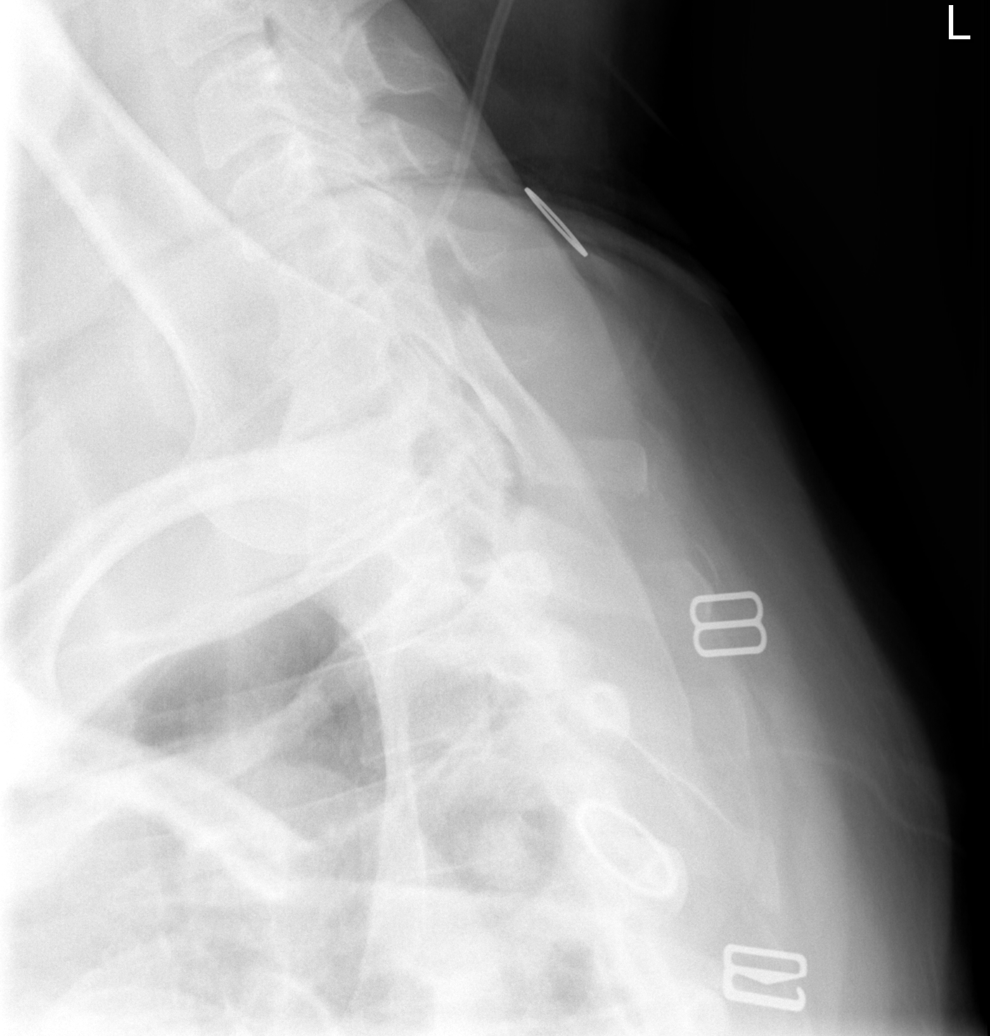

[6 of 6 positions shown; findings below may reference images not displayed]

FINDINGS: There is no evidence of an acute cervical spine fracture or
prevertebral soft tissue swelling. Approximately 1 mm to 2 mm
retrolisthesis of the C5 vertebral body is noted on C6. Mild to
moderate severity endplate sclerosis is seen at the levels of C5-C6
and C6-C7. Mild intervertebral disc space narrowing is also seen at
these levels.
IMPRESSION: Moderate severity degenerative changes at the levels of C5-C6 and
C6-C7.

## 2021-11-14 ENCOUNTER — Emergency Department (HOSPITAL_BASED_OUTPATIENT_CLINIC_OR_DEPARTMENT_OTHER): Payer: Medicare Other

## 2021-11-14 ENCOUNTER — Encounter (HOSPITAL_BASED_OUTPATIENT_CLINIC_OR_DEPARTMENT_OTHER): Payer: Self-pay

## 2021-11-14 ENCOUNTER — Emergency Department (HOSPITAL_BASED_OUTPATIENT_CLINIC_OR_DEPARTMENT_OTHER)
Admission: EM | Admit: 2021-11-14 | Discharge: 2021-11-14 | Disposition: A | Discharge status: Home / Self Care | Payer: Medicare Other | Attending: Emergency Medicine | Admitting: Emergency Medicine

## 2021-11-14 ENCOUNTER — Other Ambulatory Visit: Payer: Self-pay

## 2021-11-14 DIAGNOSIS — Z79899 Other long term (current) drug therapy: Secondary | ICD-10-CM | POA: Insufficient documentation

## 2021-11-14 DIAGNOSIS — M25562 Pain in left knee: Secondary | ICD-10-CM | POA: Diagnosis not present

## 2021-11-14 DIAGNOSIS — M25572 Pain in left ankle and joints of left foot: Secondary | ICD-10-CM | POA: Diagnosis present

## 2021-11-14 DIAGNOSIS — N183 Chronic kidney disease, stage 3 unspecified: Secondary | ICD-10-CM | POA: Diagnosis not present

## 2021-11-14 DIAGNOSIS — W1830XA Fall on same level, unspecified, initial encounter: Secondary | ICD-10-CM | POA: Diagnosis not present

## 2021-11-14 DIAGNOSIS — I129 Hypertensive chronic kidney disease with stage 1 through stage 4 chronic kidney disease, or unspecified chronic kidney disease: Secondary | ICD-10-CM | POA: Diagnosis not present

## 2021-11-14 MED ORDER — OXYCODONE-ACETAMINOPHEN 5-325 MG PO TABS
1.0000 | ORAL_TABLET | Freq: Once | ORAL | Status: AC
  Administered 2021-11-14: 1 via ORAL
  Filled 2021-11-14: qty 1

## 2021-11-14 NOTE — ED Notes (Signed)
Client also c/o left knee pain as well

## 2021-11-14 NOTE — ED Triage Notes (Signed)
Patient states her legs gave out last night - states she has a chronic condition that makes it difficult to walk.  Patient c/o left leg pain starting and her toe to her knee. Patient has good perfusion.

## 2021-11-14 NOTE — Discharge Instructions (Addendum)
We discussed your diagnosis of ankle pain and knee pain. Please follow-up with your primary care doctor for your symptoms.  Please return to the emergency department if you develop new or worsening symptoms including but not limited to headache, uncontrollable pain, syncope.

## 2021-11-14 NOTE — ED Notes (Signed)
ED Provider at bedside. 

## 2021-11-14 NOTE — ED Provider Notes (Signed)
[facility] EMERGENCY DEPARTMENT Provider Note   CSN: [id number] Arrival date & time: 11/14/21  1700     History  No chief complaint on file.   Lynn Santos is a 71 y.o. female with history of hypertension, CKD 3, arthritis, and postural tremor that presents with 1 day of left ankle and left knee pain after a ground-level fall.  The patient states that she experiences falls frequently due to her postural tremor.  She states that yesterday she felt a fall coming and tried to catch herself while falling backwards onto her buttocks.  She states that she was able to get her right leg out from underneath her but that her left leg got caught while falling backwards.  The patient states that she felt several pops in her left ankle and left knee.  The patient denies losing consciousness or hitting her head.  She denies lightheadedness or dizziness prior to falling or at this time.  Patient states that she has had surgery on her left ankle previously with hardware placed and is concerned about this. She denies having surgery done on her left knee previously. She states that she has taken tylenol at home without relief of her symptoms.  Patient denies having pain elsewhere including her left hip, neck, and back. Patient denies headache, fever, chills, nausea, vomiting, abdominal pain, diarrhea.   The history is provided by the patient.       Home Medications Prior to Admission medications   Medication Sig Start Date End Date Taking? Authorizing Provider  estrogens, conjugated, (PREMARIN) 1.25 MG tablet Take 1.25 mg by mouth at bedtime.    [provider]  hydrochlorothiazide (MICROZIDE) 12.5 MG capsule Take 1 capsule (12.5 mg total) by mouth daily. 04/18/17   Derrill Center, NP  levofloxacin (LEVAQUIN) 500 MG tablet Take 1 tablet (500 mg total) by mouth daily. 01/11/18   Jola Schmidt, MD  levothyroxine (SYNTHROID, LEVOTHROID) 137 MCG tablet Take 137 mcg by mouth daily  before breakfast.  03/11/16   [provider]  lidocaine (LIDODERM) 5 % Place 1 patch onto the skin daily. Remove & Discard patch within 12 hours or as directed by MD 05/09/20   Garald Balding, PA-C  lisinopril (PRINIVIL,ZESTRIL) 20 MG tablet Take 20 mg by mouth at bedtime.    [provider]  ondansetron (ZOFRAN) 4 MG tablet Take 1 tablet (4 mg total) by mouth every 6 (six) hours as needed for nausea or vomiting. 5/36/64   Delora Fuel, MD  oxyCODONE-acetaminophen (PERCOCET) 5-325 MG tablet Take 1 tablet by mouth every 4 (four) hours as needed for moderate pain. 07/21/45   Delora Fuel, MD  propranolol ER (INDERAL LA) 60 MG 24 hr capsule Take 60 mg by mouth at bedtime.    [provider]  tamsulosin (FLOMAX) 0.4 MG CAPS capsule Take 1 capsule (0.4 mg total) by mouth daily. 08/12/93   Delora Fuel, MD  venlafaxine XR (EFFEXOR-XR) 150 MG 24 hr capsule Take 1 capsule (150 mg total) by mouth every evening. 04/17/17   Derrill Center, NP      Allergies    Sulfa antibiotics, Codeine, and Tetracyclines & related    Review of Systems   Review of Systems  Constitutional:  Negative for chills and fever.  Gastrointestinal:  Negative for abdominal distention, diarrhea, nausea and vomiting.  Musculoskeletal:  Negative for back pain and neck pain.       Endorses left knee pain and left ankle pain.   Neurological:  Negative for dizziness, syncope, light-headedness and headaches.    Physical Exam Updated Vital Signs BP 116/75   Pulse 70   Temp 98.4 F (36.9 C) (Oral)   Resp 16   Ht '5\' 4"'$  (1.626 m)   Wt 98.9 kg   SpO2 95%   BMI 37.42 kg/m  Physical Exam Constitutional:      General: She is not in acute distress. Cardiovascular:     Rate and Rhythm: Normal rate and regular rhythm.     Pulses: Normal pulses.     Heart sounds: No murmur heard.    No friction rub. No gallop.  Pulmonary:     Effort: Pulmonary effort is normal.     Breath sounds: No wheezing, rhonchi or  rales.  Musculoskeletal:        General: No deformity.     Comments: Tenderness to palpation of the left ankle laterally. Pain with flexion and extension of the left ankle. No pain with inversion or eversion of the left ankle. Mild swelling of the left ankle compared to the right ankle. Tenderness to palpation of the left knee anteriorly and posteriorly. Pain with flexion and extension of the left knee. Good range of motion of the left knee and left ankle. 2+ edema of bilateral LE.   Skin:    Findings: No bruising.     Comments: No abrasions or lacerations.   Neurological:     Mental Status: She is alert.     Motor: No weakness.     ED Results / Procedures / Treatments   Labs (all labs ordered are listed, but only abnormal results are displayed) Labs Reviewed - No data to display  EKG None  Radiology No results found.  Procedures Procedures    Medications Ordered in ED Medications - No data to display  ED Course/ Medical Decision Making/ A&P                           Medical Decision Making Lynn Santos is a 71 y.o. female with history of hypertension, CKD 3, arthritis, and postural tremor that presents with 1 day of left ankle and left knee pain after a ground-level fall. Differential diagnosis includes fracture, dislocation. Will order XRAY of the left ankle, left foot, and left knee. Patient discussed with Dr. Laverta Baltimore.    Amount and/or Complexity of Data Reviewed Radiology: ordered and independent interpretation performed. Decision-making details documented in ED Course.  Risk Prescription drug management.   6:54 PM The patient's x-ray demonstrates a questionable nondisplaced fracture of the distal aspect of the fifth proximal phalanx and ankle soft tissue swelling but no acute fracture or dislocation of the ankle.  X-ray demonstrates orthopedic hardware in place that appears uncomplicated.  X-ray also demonstrates small joint effusion of the left knee but no acute  osseous abnormality.  Patient is feeling better after receiving her medication. Updated the patient on the results of her imaging. Upon reassessment, the patient is not tender along the distal aspect of the fifth proximal phalanx on exam.  The patient is comfortable with going home. Discussed with patient plan to follow-up with her primary care doctor for her symptoms.  Discussed with patient plan to return to the emergency department she develops new or worsening symptoms including but not limited to headache, uncontrollable pain, syncope.  Patient agrees with the plan.        Final Clinical Impression(s) / ED Diagnoses Final diagnoses:  None  Rx / DC Orders ED Discharge Orders     None         Harjit Leider, Claudia Desanctis, MD 11/14/21 Standley Dakins, MD 11/16/21 1620

## 2021-11-14 NOTE — ED Notes (Signed)
Yesterday after noon, sat down on the floor, got left leg under herself, states she is now having a lot of pain in left foot and left lower leg area, spt states she felt and heard several "pops" Able to plantar and dorsal flex left foot , but pain increases, left pedal pulse easily palpable. Left lateral aspect of foot noted to have some bruising and mild bruising noted on the toes of left foot as well.  Ice pack applied to left foot. Capillary refill WNL, warm to touch
